# Patient Record
Sex: Male | Born: 1950 | Race: White | Hispanic: No | Marital: Married | Smoking: Former smoker
Health system: Southern US, Community
[De-identification: ages and names within clinical notes are randomized; demographics above are authoritative.]

## PROBLEM LIST (undated history)

## (undated) DIAGNOSIS — I10 Essential (primary) hypertension: Secondary | ICD-10-CM

## (undated) DIAGNOSIS — C801 Malignant (primary) neoplasm, unspecified: Secondary | ICD-10-CM

## (undated) HISTORY — DX: Essential (primary) hypertension: I10

## (undated) HISTORY — DX: Malignant (primary) neoplasm, unspecified: C80.1

## (undated) HISTORY — PX: OTHER SURGICAL HISTORY: SHX169

---

## 2017-12-06 ENCOUNTER — Encounter (HOSPITAL_BASED_OUTPATIENT_CLINIC_OR_DEPARTMENT_OTHER): Payer: Self-pay

## 2017-12-06 DIAGNOSIS — G4733 Obstructive sleep apnea (adult) (pediatric): Secondary | ICD-10-CM

## 2017-12-06 DIAGNOSIS — R0683 Snoring: Secondary | ICD-10-CM

## 2017-12-30 ENCOUNTER — Ambulatory Visit: Payer: Medicare Other | Attending: Emergency Medicine | Admitting: Neurology

## 2017-12-30 DIAGNOSIS — G4733 Obstructive sleep apnea (adult) (pediatric): Secondary | ICD-10-CM | POA: Insufficient documentation

## 2017-12-30 DIAGNOSIS — R0683 Snoring: Secondary | ICD-10-CM | POA: Insufficient documentation

## 2018-01-08 NOTE — Procedures (Signed)
   [place] A. Merlene Laughter, MD     [email or web address]             NOCTURNAL POLYSOMNOGRAPHY   LOCATION: [facility]   Patient Name: Timothy Flores, Timothy Flores Date: 12/30/2017 Gender: Male D.O.B: 06-16-1951 Age (years): 66 Referring Provider: Vidal Schwalbe Height (inches): 65 Interpreting Physician: Phillips Odor MD, ABSM Weight (lbs): 193 RPSGT: Peak, Robert BMI: 32 MRN: [id number] Neck Size: 17.50 CLINICAL INFORMATION Sleep Study Type: NPSG     Indication for sleep study: OSA, Snoring     Epworth Sleepiness Score: 5     SLEEP STUDY TECHNIQUE As per the AASM Manual for the Scoring of Sleep and Associated Events v2.3 (April 2016) with a hypopnea requiring 4% desaturations.  The channels recorded and monitored were frontal, central and occipital EEG, electrooculogram (EOG), submentalis EMG (chin), nasal and oral airflow, thoracic and abdominal wall motion, anterior tibialis EMG, snore microphone, electrocardiogram, and pulse oximetry.  MEDICATIONS Medications self-administered by patient taken the night of the study : N/A No current outpatient medications on file.     SLEEP ARCHITECTURE The study was initiated at 10:53:33 PM and ended at 5:26:27 AM.  Sleep onset time was 174.4 minutes and the sleep efficiency was 35.5%%. The total sleep time was 139.5 minutes.  Stage REM latency was 76.0 minutes.  The patient spent 9.0%% of the night in stage N1 sleep, 88.5%% in stage N2 sleep, 1.4%% in stage N3 and 1.08% in REM.  Alpha intrusion was absent.  Supine sleep was 0.00%.  RESPIRATORY PARAMETERS The overall apnea/hypopnea index (AHI) was 12 per hour. There were 2 total apneas, including 2 obstructive, 0 central and 0 mixed apneas. There were 25 hypopneas and 6 RERAs.  The AHI during Stage REM sleep was 40.0 per hour.  AHI while supine was N/A per hour.  The mean oxygen saturation was 87.9%. The minimum SpO2 during sleep was 76.0%.  loud  snoring was noted during this study.  CARDIAC DATA The 2 lead EKG demonstrated sinus rhythm. The mean heart rate was 68.9 beats per minute. Other EKG findings include: None. LEG MOVEMENT DATA The total PLMS were 0 with a resulting PLMS index of 0.0. Associated arousal with leg movement index was 0.0.  IMPRESSIONS Mild to moderate obstructive sleep apnea is documented during this recording. A formal CPAP titration study is recommended.  Abnormal sleep architecture with markedly reduced sleep efficiency, markedly reduced slow-wave sleep in markedly reduced REM sleep is observed.  Delano Metz, MD Diplomate, American Board of Sleep Medicine. ELECTRONICALLY SIGNED ON:  01/08/2018, 11:48 AM [facility] PH: [phone]   FX: [phone] [place]

## 2018-09-08 ENCOUNTER — Other Ambulatory Visit (HOSPITAL_BASED_OUTPATIENT_CLINIC_OR_DEPARTMENT_OTHER): Payer: Self-pay

## 2018-09-08 DIAGNOSIS — G4730 Sleep apnea, unspecified: Secondary | ICD-10-CM

## 2018-09-22 ENCOUNTER — Ambulatory Visit: Payer: Medicare Other | Attending: Pulmonary Disease | Admitting: Neurology

## 2018-09-22 DIAGNOSIS — G4730 Sleep apnea, unspecified: Secondary | ICD-10-CM | POA: Insufficient documentation

## 2018-09-22 DIAGNOSIS — R0683 Snoring: Secondary | ICD-10-CM | POA: Diagnosis present

## 2018-09-22 DIAGNOSIS — R400 Somnolence: Secondary | ICD-10-CM | POA: Insufficient documentation

## 2018-10-03 NOTE — Procedures (Signed)
    [place] A. Merlene Laughter, MD     [email or web address]             NOCTURNAL POLYSOMNOGRAPHY   LOCATION: [facility]  Patient Name: Timothy Flores, Timothy Flores Date: 09/22/2018 Gender: Male D.O.B: [birth year] Age (years): 66 Referring Provider: Sinda Du Height (inches): 65 Interpreting Physician: Phillips Odor MD, ABSM Weight (lbs): 193 RPSGT: Peak, Robert BMI: 32 MRN: [id number] Neck Size: 17.50 CLINICAL INFORMATION The patient is referred for a BiPAP titration to treat sleep apnea.  Date of NPSG, Split Night or HST:  SLEEP STUDY TECHNIQUE As per the AASM Manual for the Scoring of Sleep and Associated Events v2.3 (April 2016) with a hypopnea requiring 4% desaturations.  The channels recorded and monitored were frontal, central and occipital EEG, electrooculogram (EOG), submentalis EMG (chin), nasal and oral airflow, thoracic and abdominal wall motion, anterior tibialis EMG, snore microphone, electrocardiogram, and pulse oximetry. Bilevel positive airway pressure (BPAP) was initiated at the beginning of the study and titrated to treat sleep-disordered breathing.  MEDICATIONS Medications self-administered by patient taken the night of the study : N/A No current outpatient medications on file.    RESPIRATORY PARAMETERS Optimal IPAP Pressure (cm): 10 AHI at Optimal Pressure (/hr) 0.0 Optimal EPAP Pressure (cm): 6   Overall Minimal O2 (%): 83.0 Minimal O2 at Optimal Pressure (%): 92.0 SLEEP ARCHITECTURE Start Time: 11:13:25 PM Stop Time: 5:19:09 AM Total Time (min): 365.7 Total Sleep Time (min): 158.5 Sleep Latency (min): 118.2 Sleep Efficiency (%): 43.3% REM Latency (min): 81.5 WASO (min): 89.1 Stage N1 (%): 3.5% Stage N2 (%): 81.4% Stage N3 (%): 0.0% Stage R (%): 15.1 Supine (%): 0.00 Arousal Index (/hr): 6.8     CARDIAC DATA The 2 lead EKG demonstrated sinus rhythm. The mean heart rate was 72.1 beats per minute. Other EKG findings include:  None.  LEG MOVEMENT DATA The total Periodic Limb Movements of Sleep (PLMS) were 0. The PLMS index was 0.0. A PLMS index of <15 is considered normal in adults.  IMPRESSIONS The optimal BIPAP selected for this patient are ( 10/6 cm of water)   Delano Metz, MD Diplomate, American Board of Sleep Medicine. ELECTRONICALLY SIGNED ON:  10/03/2018, 9:30 AM [place] PH: [phone]   FX: [phone] [place]

## 2021-01-10 ENCOUNTER — Inpatient Hospital Stay (HOSPITAL_COMMUNITY): Payer: Medicare Other | Attending: Hematology and Oncology | Admitting: Hematology and Oncology

## 2021-01-10 ENCOUNTER — Encounter (HOSPITAL_COMMUNITY): Payer: Self-pay | Admitting: Hematology and Oncology

## 2021-01-10 ENCOUNTER — Inpatient Hospital Stay (HOSPITAL_COMMUNITY): Payer: Medicare Other

## 2021-01-10 ENCOUNTER — Other Ambulatory Visit: Payer: Self-pay

## 2021-01-10 DIAGNOSIS — Z85819 Personal history of malignant neoplasm of unspecified site of lip, oral cavity, and pharynx: Secondary | ICD-10-CM | POA: Insufficient documentation

## 2021-01-10 DIAGNOSIS — I10 Essential (primary) hypertension: Secondary | ICD-10-CM | POA: Diagnosis not present

## 2021-01-10 DIAGNOSIS — Z79899 Other long term (current) drug therapy: Secondary | ICD-10-CM | POA: Diagnosis not present

## 2021-01-10 DIAGNOSIS — E538 Deficiency of other specified B group vitamins: Secondary | ICD-10-CM | POA: Diagnosis not present

## 2021-01-10 DIAGNOSIS — D539 Nutritional anemia, unspecified: Secondary | ICD-10-CM | POA: Insufficient documentation

## 2021-01-10 DIAGNOSIS — Z808 Family history of malignant neoplasm of other organs or systems: Secondary | ICD-10-CM | POA: Diagnosis not present

## 2021-01-10 DIAGNOSIS — D7589 Other specified diseases of blood and blood-forming organs: Secondary | ICD-10-CM | POA: Insufficient documentation

## 2021-01-10 DIAGNOSIS — Z87891 Personal history of nicotine dependence: Secondary | ICD-10-CM | POA: Diagnosis not present

## 2021-01-10 DIAGNOSIS — Z801 Family history of malignant neoplasm of trachea, bronchus and lung: Secondary | ICD-10-CM | POA: Diagnosis not present

## 2021-01-10 LAB — CBC WITH DIFFERENTIAL/PLATELET
Abs Immature Granulocytes: 0.03 10*3/uL (ref 0.00–0.07)
Basophils Absolute: 0 10*3/uL (ref 0.0–0.1)
Basophils Relative: 1 %
Eosinophils Absolute: 0.2 10*3/uL (ref 0.0–0.5)
Eosinophils Relative: 4 %
HCT: 34 % — ABNORMAL LOW (ref 39.0–52.0)
Hemoglobin: 11 g/dL — ABNORMAL LOW (ref 13.0–17.0)
Immature Granulocytes: 1 %
Lymphocytes Relative: 28 %
Lymphs Abs: 1.6 10*3/uL (ref 0.7–4.0)
MCH: 34.1 pg — ABNORMAL HIGH (ref 26.0–34.0)
MCHC: 32.4 g/dL (ref 30.0–36.0)
MCV: 105.3 fL — ABNORMAL HIGH (ref 80.0–100.0)
Monocytes Absolute: 0.6 10*3/uL (ref 0.1–1.0)
Monocytes Relative: 10 %
Neutro Abs: 3.3 10*3/uL (ref 1.7–7.7)
Neutrophils Relative %: 56 %
Platelets: 166 10*3/uL (ref 150–400)
RBC: 3.23 MIL/uL — ABNORMAL LOW (ref 4.22–5.81)
RDW: 13.1 % (ref 11.5–15.5)
WBC: 5.8 10*3/uL (ref 4.0–10.5)
nRBC: 0 % (ref 0.0–0.2)

## 2021-01-10 LAB — HEPATITIS PANEL, ACUTE
HCV Ab: NONREACTIVE
Hep A IgM: NONREACTIVE
Hep B C IgM: NONREACTIVE
Hepatitis B Surface Ag: NONREACTIVE

## 2021-01-10 LAB — RETICULOCYTES
Immature Retic Fract: 13.2 % (ref 2.3–15.9)
RBC.: 3.23 MIL/uL — ABNORMAL LOW (ref 4.22–5.81)
Retic Count, Absolute: 64.3 10*3/uL (ref 19.0–186.0)
Retic Ct Pct: 2 % (ref 0.4–3.1)

## 2021-01-10 LAB — VITAMIN B12: Vitamin B-12: 799 pg/mL (ref 180–914)

## 2021-01-10 LAB — LACTATE DEHYDROGENASE: LDH: 151 U/L (ref 98–192)

## 2021-01-10 NOTE — Assessment & Plan Note (Signed)
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  Patient feels quite well, denies any health complaints at all.  He was drinking about 6-8 beers every day until about a month ago.  He has been taking oral B12 supplementation from over-the-counter. He had about 3 episodes of nosebleeds and he stopped drinking after that.  No alcohol withdrawal. Physical examination, abdominal obesity noted, otherwise no findings. I reviewed his labs from December 20, 2020 which showed a hemoglobin of 8.6 and an MCV of 102.  White count and platelets appeared normal. No evidence of iron deficiency.  No hypercalcemia, elevated alkaline phosphatase or elevated total protein. I have recommended evaluation for H99, folic acid deficiency, SPEP, kappa lambda ratio, hemolysis today. 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 If there is no explanation from the above-mentioned labs, he may need a colonoscopy and a bone marrow biopsy.   He is agreeable to the above-mentioned plan.

## 2021-01-10 NOTE — Progress Notes (Signed)
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  Patient feels quite well, denies any health complaints at all.  He was drinking about 6-8 beers every day until about a month ago.  He has been taking oral B12 supplementation from over-the-counter. He had about 3 episodes of nosebleeds and he stopped drinking after that.  No alcohol withdrawal. Physical examination, abdominal obesity noted, otherwise no findings. I reviewed his labs from December 20, 2020 which showed a hemoglobin of 8.6 and an MCV of 102.  White count and platelets appeared normal. No evidence of iron deficiency.  No hypercalcemia, elevated alkaline phosphatase or elevated total protein. I have recommended evaluation for K44, folic acid deficiency, SPEP, kappa lambda ratio, hemolysis today. 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 If there is no explanation from the above-mentioned labs, he may need a colonoscopy and a bone marrow biopsy.   He is agreeable to the above-mentioned plan.  Orders Placed This Encounter  Procedures  . CBC with Differential/Platelet    Standing Status:   Standing    Number of Occurrences:   22    Standing Expiration Date:   01/10/2022  . Pathologist smear review    Standing Status:   Future    Standing Expiration Date:   01/10/2022  . Vitamin B12    Standing Status:   Future    Standing Expiration Date:   01/10/2022  . Lactate dehydrogenase    Standing Status:   Future    Standing Expiration Date:    01/10/2022  . Reticulocytes    Standing Status:   Future    Standing Expiration Date:   01/10/2022  . Hepatitis panel, acute    Standing Status:   Future    Standing Expiration Date:   01/10/2022  . SPEP (Serum protein electrophoresis)    Standing Status:   Future    Standing Expiration Date:   01/10/2022  . Kappa/lambda light chains    Standing Status:   Future    Standing Expiration Date:   01/10/2022  . Folate RBC    Standing Status:   Future    Standing Expiration Date:   01/10/2022     HISTORY OF PRESENTING ILLNESS:   Timothy Flores 70 y.o. male is here because of macrocytic anemia.  This is a very pleasant 70 year old male patient with past medical history significant for left cheek cancer status post surgery in 2012, hypertension, alcohol injection referred to hematology for evaluation of macrocytic anemia.  Timothy Flores arrived to the appointment today with his wife.  He feels quite well.  He denies any fatigue, shortness of breath on exertion, loss of appetite but does admit to some weight loss.  He and his wife appeared to have some disagreement about how much weight he lost.  He thinks, he may have lost about 5 pounds.  He denies any changes in bowel habits.  He has never had a colonoscopy, did a stool test last year.  No hematochezia or melena.  No change in dietary habits.  He does have some  vitamin B12 deficiency and takes over-the-counter vitamin B12 supplementation.  He used to drink about 6-8 beers a day until about a month ago when he started to have severe nosebleeds and he stopped drinking.  He denies any change in urinary habits. No known autoimmune diseases. Rest of the pertinent 10 point ROS reviewed and negative.  REVIEW OF SYSTEMS:    Constitutional: Denies fevers, chills or abnormal night sweats Eyes: Denies blurriness of vision, double vision or watery eyes Ears, nose, mouth, throat, and face: Denies mucositis or sore throat Respiratory: Denies cough, dyspnea or  wheezes Cardiovascular: Denies palpitation, chest discomfort or lower extremity swelling Gastrointestinal:  Denies nausea, heartburn or change in bowel habits Skin: Denies abnormal skin rashes Lymphatics: Denies new lymphadenopathy or easy bruising Neurological:Denies numbness, tingling or new weaknesses Behavioral/Psych: Mood is stable, no new changes  All other systems were reviewed with the patient and are negative.  MEDICAL HISTORY:  Past Medical History:  Diagnosis Date  . Cancer ([place])    oral cancer-2013  . Hypertension     SURGICAL HISTORY: Past Surgical History:  Procedure Laterality Date  . cancer removal     oral cancer 2013    SOCIAL HISTORY: Social History   Socioeconomic History  . Marital status: Married    Spouse name: Not on file  . Number of children: 2  . Years of education: Not on file  . Highest education level: Not on file  Occupational History  . Occupation: Patent examiner center  . Occupation: retired  . Occupation: Nature conservation officer  Tobacco Use  . Smoking status: Former Smoker    Years: 50.00    Types: Cigarettes  . Smokeless tobacco: Former Network engineer and Sexual Activity  . Alcohol use: Not Currently  . Drug use: Not Currently  . Sexual activity: Not Currently  Other Topics Concern  . Not on file  Social History Narrative  . Not on file   Social Determinants of Health   Financial Resource Strain: Medium Risk  . Difficulty of Paying Living Expenses: Somewhat hard  Food Insecurity: No Food Insecurity  . Worried About Charity fundraiser in the Last Year: Never true  . Ran Out of Food in the Last Year: Never true  Transportation Needs: No Transportation Needs  . Lack of Transportation (Medical): No  . Lack of Transportation (Non-Medical): No  Physical Activity: Not on file  Stress: No Stress Concern Present  . Feeling of Stress : Not at all  Social Connections: Socially Integrated  . Frequency of Communication with Friends and  Family: Twice a week  . Frequency of Social Gatherings with Friends and Family: Once a week  . Attends Religious Services: More than 4 times per year  . Active Member of Clubs or Organizations: Yes  . Attends Archivist Meetings: Never  . Marital Status: Married  Human resources officer Violence: Not At Risk  . Fear of Current or Ex-Partner: No  . Emotionally Abused: No  . Physically Abused: No  . Sexually Abused: No    FAMILY HISTORY: Family History  Problem Relation Age of Onset  . Stroke Mother   . Cancer Paternal Uncle        lung cancer  . Cancer Cousin        brain cancer    ALLERGIES:  has no allergies on file.  MEDICATIONS:  Current Outpatient Medications  Medication Sig Dispense Refill  . allopurinol (ZYLOPRIM) 300 MG tablet Take 300 mg by mouth daily.    Marland Kitchen  Apple Cid Vn-Grn Tea-Bit Or-Cr (APPLE CIDER VINEGAR PLUS) TABS See admin instructions.    . Ascorbic Acid (VITAMIN C) 1000 MG tablet 1 tablet    . cetirizine (ZYRTEC) 10 MG tablet Take 10 mg by mouth daily.    . cholecalciferol (VITAMIN D) 25 MCG (1000 UNIT) tablet 1 tablet    . Cyanocobalamin (VITAMIN B12) 1000 MCG TBCR 1 tablet    . lisinopril (ZESTRIL) 20 MG tablet Take 20 mg by mouth daily.    . montelukast (SINGULAIR) 10 MG tablet take 1 tablet by mouth every evening as needed for allergies.    . Multiple Vitamins-Minerals (CENTRUM ADULTS PO)     . Omega-3 Fatty Acids (FISH OIL) 1000 MG CAPS 1 capsule    . rosuvastatin (CRESTOR) 20 MG tablet Take 20 mg by mouth at bedtime.    . vitamin E 180 MG (400 UNITS) capsule Take 400 Units by mouth daily.    . traZODone (DESYREL) 100 MG tablet Take 100 mg by mouth at bedtime. (Patient not taking: Reported on 01/10/2021)     No current facility-administered medications for this visit.     PHYSICAL EXAMINATION:  ECOG PERFORMANCE STATUS: 0 - Asymptomatic  There were no vitals filed for this visit. There were no vitals filed for this visit.  GENERAL:alert, no  distress and comfortable SKIN: skin color, texture, turgor are normal, no rashes or significant lesions EYES: normal, conjunctiva are pink and non-injected, sclera clear OROPHARYNX:no exudate, no erythema and lips, buccal mucosa, and tongue normal  NECK: supple, thyroid normal size, non-tender, without nodularity LYMPH:  no palpable lymphadenopathy in the cervical, axillary or inguinal LUNGS: clear to auscultation and percussion with normal breathing effort HEART: regular rate & rhythm and no murmurs and no lower extremity edema ABDOMEN:abdomen soft, non-tender and normal bowel sounds Musculoskeletal:no cyanosis of digits and no clubbing  PSYCH: alert & oriented x 3 with fluent speech NEURO: no focal motor/sensory deficits  LABORATORY DATA:  I have reviewed the data as listed No results found for: WBC, HGB, HCT, MCV, PLT   Chemistry   No results found for: NA, K, CL, CO2, BUN, CREATININE, GLU No results found for: CALCIUM, ALKPHOS, AST, ALT, BILITOT     RADIOGRAPHIC STUDIES: I have personally reviewed the radiological images as listed and agreed with the findings in the report. No results found.  All questions were answered. The patient knows to call the clinic with any problems, questions or concerns. I spent 45 minutes in the care of this patient including H and P, review of records, counseling and coordination of care.     Benay Pike, MD 01/10/2021 2:44 PM

## 2021-01-13 LAB — PATHOLOGIST SMEAR REVIEW

## 2021-01-20 ENCOUNTER — Other Ambulatory Visit: Payer: Self-pay

## 2021-01-20 ENCOUNTER — Inpatient Hospital Stay (HOSPITAL_COMMUNITY): Payer: Medicare Other | Attending: Hematology

## 2021-01-20 DIAGNOSIS — D539 Nutritional anemia, unspecified: Secondary | ICD-10-CM | POA: Insufficient documentation

## 2021-01-21 LAB — KAPPA/LAMBDA LIGHT CHAINS
Kappa free light chain: 32.9 mg/L — ABNORMAL HIGH (ref 3.3–19.4)
Kappa, lambda light chain ratio: 1.38 (ref 0.26–1.65)
Lambda free light chains: 23.9 mg/L (ref 5.7–26.3)

## 2021-01-21 LAB — FOLATE RBC
Folate, Hemolysate: 570 ng/mL
Folate, RBC: 1686 ng/mL (ref >498)
Hematocrit: 33.8 % — ABNORMAL LOW (ref 37.5–51.0)

## 2021-01-23 LAB — PROTEIN ELECTROPHORESIS, SERUM
A/G Ratio: 1.5 (ref 0.7–1.7)
Albumin ELP: 3.8 g/dL (ref 2.9–4.4)
Alpha-1-Globulin: 0.2 g/dL (ref 0.0–0.4)
Alpha-2-Globulin: 0.7 g/dL (ref 0.4–1.0)
Beta Globulin: 0.9 g/dL (ref 0.7–1.3)
Gamma Globulin: 0.8 g/dL (ref 0.4–1.8)
Globulin, Total: 2.6 g/dL (ref 2.2–3.9)
Total Protein ELP: 6.4 g/dL (ref 6.0–8.5)

## 2021-01-27 ENCOUNTER — Ambulatory Visit (HOSPITAL_COMMUNITY): Payer: Medicare Other | Admitting: Hematology and Oncology

## 2021-01-27 ENCOUNTER — Inpatient Hospital Stay (HOSPITAL_BASED_OUTPATIENT_CLINIC_OR_DEPARTMENT_OTHER): Payer: Medicare Other | Admitting: Hematology and Oncology

## 2021-01-27 ENCOUNTER — Other Ambulatory Visit: Payer: Self-pay

## 2021-01-27 VITALS — BP 126/64 | HR 80 | Temp 97.2°F | Resp 18 | Wt 177.8 lb

## 2021-01-27 DIAGNOSIS — D539 Nutritional anemia, unspecified: Secondary | ICD-10-CM | POA: Diagnosis not present

## 2021-01-27 NOTE — Progress Notes (Signed)
[facility] NOTE  Patient Care Team: Vidal Schwalbe, MD as PCP - General (Family Medicine)  CHIEF COMPLAINTS/PURPOSE OF CONSULTATION:  Macrocytic anemia  ASSESSMENT & PLAN:  No problem-specific Assessment & Plan notes found for this encounter.  No orders of the defined types were placed in this encounter.  This is a very pleasant 70 year old male patient with macrocytosis and anemia referred to hematology for additional recommendations. He denies any major complaints except for heavy alcohol intake and nosebleeds. During his initial visit we recommended further investigation, normal E33 and folic acid levels.  Hepatitis panel nonreactive.  No evidence of hemolysis.  No evidence of monoclonal protein. We have discussed that this macrocytosis could be a sign of early dysplastic changes in the bone marrow but since he does not need immediate treatment have given him 2 options.  We could proceed with bone marrow aspiration biopsy now versus follow him up in 3 months versus do bone marrow aspiration biopsy in the future when there is decline in counts.  He is agreeable to monitoring.  Encouraged alcohol cessation. He will return to clinic in 3 months with repeat labs.  HISTORY OF PRESENTING ILLNESS:   Timothy Flores 70 y.o. male is here because of macrocytic anemia.  This is a very pleasant 70 year old male patient with past medical history significant for left cheek cancer status post surgery in 2012, hypertension, referred to hematology for evaluation of macrocytic anemia.  [name] arrived to the appointment today with his wife.     Interval history  Since his last visit, he has no complaints. No more during his initial visit, recommended additional He stopped drinking completely. No change in breathing, bowel or urinary habits. He feels quite well  Rest of the pertinent 10 point ROS reviewed and negative.  REVIEW OF SYSTEMS:    Constitutional: Denies  fevers, chills or abnormal night sweats Eyes: Denies blurriness of vision, double vision or watery eyes Ears, nose, mouth, throat, and face: Denies mucositis or sore throat Respiratory: Denies cough, dyspnea or wheezes Cardiovascular: Denies palpitation, chest discomfort or lower extremity swelling Gastrointestinal:  Denies nausea, heartburn or change in bowel habits Skin: Denies abnormal skin rashes Lymphatics: Denies new lymphadenopathy or easy bruising Neurological:Denies numbness, tingling or new weaknesses Behavioral/Psych: Mood is stable, no new changes  All other systems were reviewed with the patient and are negative.  MEDICAL HISTORY:  Past Medical History:  Diagnosis Date  . Cancer ([place])    oral cancer-2013  . Hypertension     SURGICAL HISTORY: Past Surgical History:  Procedure Laterality Date  . cancer removal     oral cancer 2013    SOCIAL HISTORY: Social History   Socioeconomic History  . Marital status: Married    Spouse name: Not on file  . Number of children: 2  . Years of education: Not on file  . Highest education level: Not on file  Occupational History  . Occupation: Patent examiner center  . Occupation: retired  . Occupation: Nature conservation officer  Tobacco Use  . Smoking status: Former Smoker    Years: 50.00    Types: Cigarettes  . Smokeless tobacco: Former Network engineer and Sexual Activity  . Alcohol use: Not Currently  . Drug use: Not Currently  . Sexual activity: Not Currently  Other Topics Concern  . Not on file  Social History Narrative  . Not on file   Social Determinants of Health   Financial Resource Strain: Medium Risk  . Difficulty  of Paying Living Expenses: Somewhat hard  Food Insecurity: No Food Insecurity  . Worried About Charity fundraiser in the Last Year: Never true  . Ran Out of Food in the Last Year: Never true  Transportation Needs: No Transportation Needs  . Lack of Transportation (Medical): No  . Lack of  Transportation (Non-Medical): No  Physical Activity: Not on file  Stress: No Stress Concern Present  . Feeling of Stress : Not at all  Social Connections: Socially Integrated  . Frequency of Communication with Friends and Family: Twice a week  . Frequency of Social Gatherings with Friends and Family: Once a week  . Attends Religious Services: More than 4 times per year  . Active Member of Clubs or Organizations: Yes  . Attends Archivist Meetings: Never  . Marital Status: Married  Human resources officer Violence: Not At Risk  . Fear of Current or Ex-Partner: No  . Emotionally Abused: No  . Physically Abused: No  . Sexually Abused: No    FAMILY HISTORY: Family History  Problem Relation Age of Onset  . Stroke Mother   . Cancer Paternal Uncle        lung cancer  . Cancer Cousin        brain cancer    ALLERGIES:  has no allergies on file.  MEDICATIONS:  Current Outpatient Medications  Medication Sig Dispense Refill  . allopurinol (ZYLOPRIM) 300 MG tablet Take 300 mg by mouth daily.    Marland Kitchen Apple Cid Vn-Grn Tea-Bit Or-Cr (APPLE CIDER VINEGAR PLUS) TABS See admin instructions.    . Ascorbic Acid (VITAMIN C) 1000 MG tablet 1 tablet    . cetirizine (ZYRTEC) 10 MG tablet Take 10 mg by mouth daily.    . cholecalciferol (VITAMIN D) 25 MCG (1000 UNIT) tablet 1 tablet    . Cyanocobalamin (VITAMIN B12) 1000 MCG TBCR 1 tablet    . lisinopril (ZESTRIL) 20 MG tablet Take 20 mg by mouth daily.    . montelukast (SINGULAIR) 10 MG tablet take 1 tablet by mouth every evening as needed for allergies.    . Multiple Vitamins-Minerals (CENTRUM ADULTS PO)     . Omega-3 Fatty Acids (FISH OIL) 1000 MG CAPS 1 capsule    . rosuvastatin (CRESTOR) 20 MG tablet Take 20 mg by mouth at bedtime.    . traZODone (DESYREL) 100 MG tablet Take 100 mg by mouth at bedtime.    . vitamin E 180 MG (400 UNITS) capsule Take 400 Units by mouth daily.     No current facility-administered medications for this visit.      PHYSICAL EXAMINATION:  ECOG PERFORMANCE STATUS: 0 - Asymptomatic  Vitals:   01/27/21 1412  BP: 126/64  Pulse: 80  Resp: 18  Temp: (!) 97.2 F (36.2 C)  SpO2: 99%   Filed Weights   01/27/21 1412  Weight: 177 lb 12.8 oz (80.6 kg)    GENERAL:alert, no distress and comfortable SKIN: skin color, texture, turgor are normal, no rashes or significant lesions EYES: normal, conjunctiva are pink and non-injected, sclera clear OROPHARYNX:no exudate, no erythema and lips, buccal mucosa, and tongue normal  NECK: supple, thyroid normal size, non-tender, without nodularity LYMPH:  no palpable lymphadenopathy in the cervical, axillary or inguinal LUNGS: clear to auscultation and percussion with normal breathing effort HEART: regular rate & rhythm and no murmurs and no lower extremity edema ABDOMEN:abdomen soft, non-tender and normal bowel sounds Musculoskeletal:no cyanosis of digits and no clubbing  PSYCH: alert & oriented x  3 with fluent speech NEURO: no focal motor/sensory deficits  LABORATORY DATA:  I have reviewed the data as listed Lab Results  Component Value Date   WBC 5.8 01/10/2021   HGB 11.0 (L) 01/10/2021   HCT 33.8 (L) 01/20/2021   MCV 105.3 (H) 01/10/2021   PLT 166 01/10/2021     Chemistry   No results found for: NA, K, CL, CO2, BUN, CREATININE, GLU No results found for: CALCIUM, ALKPHOS, AST, ALT, BILITOT   We reviewed labs from today. Macrocytosis, otherwise no major concerns. SPEP neg B12 and folate normal  RADIOGRAPHIC STUDIES: I have personally reviewed the radiological images as listed and agreed with the findings in the report. No results found.  All questions were answered. The patient knows to call the clinic with any problems, questions or concerns. I spent 20 minutes in the care of this patient including H and P, review of records, counseling and coordination of care.     Benay Pike, MD 01/27/2021 2:39 PM

## 2021-01-28 ENCOUNTER — Encounter (HOSPITAL_COMMUNITY): Payer: Self-pay | Admitting: Hematology and Oncology

## 2021-04-29 ENCOUNTER — Other Ambulatory Visit (HOSPITAL_COMMUNITY): Payer: Medicare Other

## 2021-04-29 ENCOUNTER — Other Ambulatory Visit: Payer: Self-pay

## 2021-04-29 ENCOUNTER — Inpatient Hospital Stay (HOSPITAL_COMMUNITY): Payer: Medicare Other | Attending: Hematology

## 2021-04-29 DIAGNOSIS — D696 Thrombocytopenia, unspecified: Secondary | ICD-10-CM | POA: Insufficient documentation

## 2021-04-29 DIAGNOSIS — D539 Nutritional anemia, unspecified: Secondary | ICD-10-CM | POA: Insufficient documentation

## 2021-04-29 LAB — CBC WITH DIFFERENTIAL/PLATELET
Abs Immature Granulocytes: 0.03 10*3/uL (ref 0.00–0.07)
Basophils Absolute: 0 10*3/uL (ref 0.0–0.1)
Basophils Relative: 1 %
Eosinophils Absolute: 0.3 10*3/uL (ref 0.0–0.5)
Eosinophils Relative: 5 %
HCT: 32.4 % — ABNORMAL LOW (ref 39.0–52.0)
Hemoglobin: 11 g/dL — ABNORMAL LOW (ref 13.0–17.0)
Immature Granulocytes: 1 %
Lymphocytes Relative: 38 %
Lymphs Abs: 2.3 10*3/uL (ref 0.7–4.0)
MCH: 33.8 pg (ref 26.0–34.0)
MCHC: 34 g/dL (ref 30.0–36.0)
MCV: 99.7 fL (ref 80.0–100.0)
Monocytes Absolute: 0.6 10*3/uL (ref 0.1–1.0)
Monocytes Relative: 10 %
Neutro Abs: 2.9 10*3/uL (ref 1.7–7.7)
Neutrophils Relative %: 45 %
Platelets: 134 10*3/uL — ABNORMAL LOW (ref 150–400)
RBC: 3.25 MIL/uL — ABNORMAL LOW (ref 4.22–5.81)
RDW: 16.1 % — ABNORMAL HIGH (ref 11.5–15.5)
WBC: 6.1 10*3/uL (ref 4.0–10.5)
nRBC: 0 % (ref 0.0–0.2)

## 2021-04-29 LAB — COMPREHENSIVE METABOLIC PANEL
ALT: 18 U/L (ref 0–44)
AST: 26 U/L (ref 15–41)
Albumin: 3.9 g/dL (ref 3.5–5.0)
Alkaline Phosphatase: 60 U/L (ref 38–126)
Anion gap: 7 (ref 5–15)
BUN: 21 mg/dL (ref 8–23)
CO2: 25 mmol/L (ref 22–32)
Calcium: 9 mg/dL (ref 8.9–10.3)
Chloride: 107 mmol/L (ref 98–111)
Creatinine, Ser: 1.01 mg/dL (ref 0.61–1.24)
GFR, Estimated: >60 mL/min (ref >60)
Glucose, Bld: 106 mg/dL — ABNORMAL HIGH (ref 70–99)
Potassium: 3.8 mmol/L (ref 3.5–5.1)
Sodium: 139 mmol/L (ref 135–145)
Total Bilirubin: 0.6 mg/dL (ref 0.3–1.2)
Total Protein: 6.8 g/dL (ref 6.5–8.1)

## 2021-05-05 NOTE — Progress Notes (Signed)
[place],  [id number]   CLINIC:  Medical Oncology/Hematology  PCP:  Vidal Schwalbe, MD [address] [place] [address] [phone]   REASON FOR VISIT:  Follow-up for macrocytic anemia  PRIOR THERAPY: None  CURRENT THERAPY: Observation  INTERVAL HISTORY:  Timothy Flores 70 y.o. male returns for routine follow-up of macrocytic anemia.  He was last seen by Dr. Chryl Heck on 01/27/2021.  At today's visit, he reports feeling fairly well.  No recent hospitalizations, surgeries, or changes in baseline health status.  He denies any current signs or symptoms of blood loss or severe taxis, bright red blood per rectum, or melena.  He denies easy bruising and petechial rash.  He has not noticed any chest pain, dyspnea on exertion, excessive fatigue, palpitations, or syncopal episodes.  Continues to report that he stopped alcohol intake after his visit in March 2022.  After stopping alcohol, he has lost about 14 pounds.  However, he denies any other B symptoms such as fever, chills, night sweats.  He has 100% energy and 100% appetite. He endorses that he is maintaining a stable weight.    REVIEW OF SYSTEMS:  Review of Systems  Constitutional:  Negative for appetite change, chills, diaphoresis, fatigue, fever and unexpected weight change.  HENT:   Negative for lump/mass and nosebleeds.   Eyes:  Negative for eye problems.  Respiratory:  Negative for cough, hemoptysis and shortness of breath.   Cardiovascular:  Negative for chest pain, leg swelling and palpitations.  Gastrointestinal:  Negative for abdominal pain, blood in stool, constipation, diarrhea, nausea and vomiting.  Genitourinary:  Negative for hematuria.   Skin: Negative.   Neurological:  Negative for dizziness, headaches and light-headedness.  Hematological:  Does not bruise/bleed easily.     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   Hypertension    Past Surgical History:  Procedure Laterality Date   cancer removal     oral cancer 2013     SOCIAL HISTORY:  Social History   Socioeconomic History   Marital status: Married    Spouse name: Not on file   Number of children: 2   Years of education: Not on file   Highest education level: Not on file  Occupational History   Occupation: [employer] examiner [employer]   Occupation: retired   Occupation: Nature conservation officer  Tobacco Use   Smoking status: Former    Years: 50.00    Types: Cigarettes   Smokeless tobacco: Former  Substance and Sexual Activity   Alcohol use: Not Currently   Drug use: Not Currently   Sexual activity: Not Currently  Other Topics Concern   Not on file  Social History Narrative   Not on file   Social Determinants of Health   Financial Resource Strain: Medium Risk   Difficulty of Paying Living Expenses: Somewhat hard  Food Insecurity: No Food Insecurity   Worried About Charity fundraiser in the Last Year: Never true   Arboriculturist in the Last Year: Never true  Transportation Needs: No Transportation Needs   Lack of Transportation (Medical): No   Lack of Transportation (Non-Medical): No  Physical Activity: Not on file  Stress: No Stress Concern Present   Feeling of Stress : Not at all  Social Connections: Socially Integrated   Frequency of Communication with Friends and Family: Twice a week   Frequency of Social  Gatherings with Friends and Family: Once a week   Attends Religious Services: More than 4 times per year   Active Member of [employer] or Organizations: Yes   Attends Archivist Meetings: Never   Marital Status: Married  Human resources officer Violence: Not At Risk   Fear of Current or Ex-Partner: No   Emotionally Abused: No   Physically Abused: No   Sexually Abused: No    FAMILY HISTORY:  Family History  Problem Relation Age of Onset   Stroke Mother    Cancer Paternal Uncle        lung cancer    Cancer Cousin        brain cancer    CURRENT MEDICATIONS:  Outpatient Encounter Medications as of 05/06/2021  Medication Sig   allopurinol (ZYLOPRIM) 300 MG tablet Take 300 mg by mouth daily.   Apple Cid Vn-Grn Tea-Bit Or-Cr (APPLE CIDER VINEGAR PLUS) TABS See admin instructions.   Ascorbic Acid (VITAMIN C) 1000 MG tablet 1 tablet   cetirizine (ZYRTEC) 10 MG tablet Take 10 mg by mouth daily.   cholecalciferol (VITAMIN D) 25 MCG (1000 UNIT) tablet 1 tablet   Cyanocobalamin (VITAMIN B12) 1000 MCG TBCR 1 tablet   lisinopril (ZESTRIL) 20 MG tablet Take 20 mg by mouth daily.   montelukast (SINGULAIR) 10 MG tablet take 1 tablet by mouth every evening as needed for allergies.   Multiple Vitamins-Minerals (CENTRUM ADULTS PO)    Omega-3 Fatty Acids (FISH OIL) 1000 MG CAPS 1 capsule   rosuvastatin (CRESTOR) 20 MG tablet Take 20 mg by mouth at bedtime.   traZODone (DESYREL) 100 MG tablet Take 100 mg by mouth at bedtime.   vitamin E 180 MG (400 UNITS) capsule Take 400 Units by mouth daily.   No facility-administered encounter medications on file as of 05/06/2021.    ALLERGIES:  Not on File   PHYSICAL EXAM:  ECOG PERFORMANCE STATUS: 0 - Asymptomatic  There were no vitals filed for this visit. There were no vitals filed for this visit. Physical Exam Constitutional:      Appearance: Normal appearance. He is obese.  HENT:     Head: Normocephalic and atraumatic.     Mouth/Throat:     Mouth: Mucous membranes are moist.  Eyes:     Extraocular Movements: Extraocular movements intact.     Pupils: Pupils are equal, round, and reactive to light.  Cardiovascular:     Rate and Rhythm: Normal rate and regular rhythm.     Pulses: Normal pulses.     Heart sounds: Normal heart sounds.  Pulmonary:     Effort: Pulmonary effort is normal.     Breath sounds: Normal breath sounds.  Abdominal:     General: Bowel sounds are normal.     Palpations: Abdomen is soft.     Tenderness: There is no abdominal  tenderness.  Musculoskeletal:        General: No swelling.     Right lower leg: No edema.     Left lower leg: No edema.  Lymphadenopathy:     Cervical: No cervical adenopathy.  Skin:    General: Skin is warm and dry.  Neurological:     General: No focal deficit present.     Mental Status: He is alert and oriented to person, place, and time.  Psychiatric:        Mood and Affect: Mood normal.        Behavior: Behavior normal.     LABORATORY DATA:  I  have reviewed the labs as listed.  CBC    Component Value Date/Time   WBC 6.1 04/29/2021 1419   RBC 3.25 (L) 04/29/2021 1419   HGB 11.0 (L) 04/29/2021 1419   HCT 32.4 (L) 04/29/2021 1419   HCT 33.8 (L) 01/20/2021 1416   PLT 134 (L) 04/29/2021 1419   MCV 99.7 04/29/2021 1419   MCH 33.8 04/29/2021 1419   MCHC 34.0 04/29/2021 1419   RDW 16.1 (H) 04/29/2021 1419   LYMPHSABS 2.3 04/29/2021 1419   MONOABS 0.6 04/29/2021 1419   EOSABS 0.3 04/29/2021 1419   BASOSABS 0.0 04/29/2021 1419   CMP Latest Ref Rng & Units 04/29/2021  Glucose 70 - 99 mg/dL 106(H)  BUN 8 - 23 mg/dL 21  Creatinine 0.61 - 1.24 mg/dL 1.01  Sodium 135 - 145 mmol/L 139  Potassium 3.5 - 5.1 mmol/L 3.8  Chloride 98 - 111 mmol/L 107  CO2 22 - 32 mmol/L 25  Calcium 8.9 - 10.3 mg/dL 9.0  Total Protein 6.5 - 8.1 g/dL 6.8  Total Bilirubin 0.3 - 1.2 mg/dL 0.6  Alkaline Phos 38 - 126 U/L 60  AST 15 - 41 U/L 26  ALT 0 - 44 U/L 18    DIAGNOSTIC IMAGING:  I have independently reviewed the relevant imaging and discussed with the patient.  ASSESSMENT & PLAN: 1.  Macrocytic anemia - Normal A41 and folic acid levels, hepatitis panel nonreactive, no evidence of hemolysis or monoclonal protein - Reticulocyte index of 1.08 indicates hypoproliferation - Macrocytosis could be a sign of early dysplastic changes in the bone marrow - Patient also has history of alcohol consumption, but has stopped drinking entirely since 01/10/2021  - Patient opted to observe for now, but  will consider bone marrow biopsy aspiration in the future if there is a decline in blood counts - Most recent labs (04/29/2021): Stable Hgb 11.0, but with decreased macrocytosis (MCV 99.7) - PLAN: Repeat labs and RTC in 4 months (include thyroid studies and iron studies with next lab draw).  Will readdress possible bone marrow biopsy if any significant deviation from baseline.  2.  Thrombocytopenia, mild - CBC on 04/29/2021 notes mild thrombocytopenia with platelets 134 - Normal Y60 and folic acid levels, hepatitis panel nonreactive, no evidence of hemolysis or monoclonal protein - Patient may have some underlying fatty liver disease or alcohol related liver disease causing splenomegaly - PLAN: No indication for treatment at this time.  Will monitor with repeat CBC in 4 months.   PLAN SUMMARY & DISPOSITION: -Labs and RTC in 4 months  All questions were answered. The patient knows to call the clinic with any problems, questions or concerns.  Medical decision making: Low  Time spent on visit: I spent 15 minutes counseling the patient face to face. The total time spent in the appointment was 25 minutes and more than 50% was on counseling.   Harriett Rush, PA-C  05/06/2021 3:15 PM

## 2021-05-06 ENCOUNTER — Inpatient Hospital Stay (HOSPITAL_BASED_OUTPATIENT_CLINIC_OR_DEPARTMENT_OTHER): Payer: Medicare Other | Admitting: Physician Assistant

## 2021-05-06 ENCOUNTER — Ambulatory Visit (HOSPITAL_COMMUNITY): Payer: Medicare Other | Admitting: Physician Assistant

## 2021-05-06 ENCOUNTER — Other Ambulatory Visit: Payer: Self-pay

## 2021-05-06 VITALS — BP 102/51 | HR 72 | Temp 96.9°F | Resp 18 | Wt 163.7 lb

## 2021-05-06 DIAGNOSIS — D539 Nutritional anemia, unspecified: Secondary | ICD-10-CM | POA: Diagnosis not present

## 2021-05-06 NOTE — Patient Instructions (Signed)
[place] at [facility] Discharge Instructions  You were seen today by Tarri Abernethy PA-C for your anemia.  As we have previously discussed, we do not know the specific cause of your anemia, and would need to perform a bone marrow biopsy for more information.  However, this is not strictly necessary at this time, and we can continue to watch and wait and see how your blood counts look over the next months and years.  We will reconsider bone marrow in the future if we notice any significant changes in your blood.  Continue to stay away from drinking alcohol, as this can also be toxic to your bone marrow and cause many other health conditions.  LABS: Return in 4 months for repeat labs  OTHER TESTS: None  MEDICATIONS: No changes to home medications  FOLLOW-UP APPOINTMENT: Office visit in 4 months, about a week after your labs are checked   Thank you for choosing [place] at [facility] to provide your oncology and hematology care.  To afford each patient quality time with our provider, please arrive at least 15 minutes before your scheduled appointment time.   If you have a lab appointment with the [facility] please come in thru the Main Entrance and check in at the main information desk.  You need to re-schedule your appointment should you arrive 10 or more minutes late.  We strive to give you quality time with our providers, and arriving late affects you and other patients whose appointments are after yours.  Also, if you no show three or more times for appointments you may be dismissed from the clinic at the providers discretion.     Again, thank you for choosing [facility].  Our hope is that these requests will decrease the amount of time that you wait before being seen by our physicians.       _____________________________________________________________  Should you have questions after your visit to [facility], please contact our office at [phone] and follow the prompts.  Our office hours are 8:00 a.m. and 4:30 p.m. Monday - Friday.  Please note that voicemails left after 4:00 p.m. may not be returned until the following business day.  We are closed weekends and major holidays.  You do have access to a nurse 24-7, just call the main number to the clinic [phone] and do not press any options, hold on the line and a nurse will answer the phone.    For prescription refill requests, have your pharmacy contact our office and allow 72 hours.    Due to Covid, you will need to wear a mask upon entering the hospital. If you do not have a mask, a mask will be given to you at the Main Entrance upon arrival. For doctor visits, patients may have 1 support person age 48 or older with them. For treatment visits, patients can not have anyone with them due to social distancing guidelines and our immunocompromised population.

## 2021-09-03 ENCOUNTER — Inpatient Hospital Stay (HOSPITAL_COMMUNITY): Payer: Medicare Other | Attending: Hematology

## 2021-09-03 ENCOUNTER — Other Ambulatory Visit: Payer: Self-pay

## 2021-09-03 DIAGNOSIS — Z79899 Other long term (current) drug therapy: Secondary | ICD-10-CM | POA: Diagnosis not present

## 2021-09-03 DIAGNOSIS — D539 Nutritional anemia, unspecified: Secondary | ICD-10-CM | POA: Diagnosis not present

## 2021-09-03 LAB — CBC WITH DIFFERENTIAL/PLATELET
Abs Immature Granulocytes: 0.04 10*3/uL (ref 0.00–0.07)
Basophils Absolute: 0 10*3/uL (ref 0.0–0.1)
Basophils Relative: 1 %
Eosinophils Absolute: 0.2 10*3/uL (ref 0.0–0.5)
Eosinophils Relative: 4 %
HCT: 36.9 % — ABNORMAL LOW (ref 39.0–52.0)
Hemoglobin: 12.6 g/dL — ABNORMAL LOW (ref 13.0–17.0)
Immature Granulocytes: 1 %
Lymphocytes Relative: 36 %
Lymphs Abs: 2.3 10*3/uL (ref 0.7–4.0)
MCH: 34.9 pg — ABNORMAL HIGH (ref 26.0–34.0)
MCHC: 34.1 g/dL (ref 30.0–36.0)
MCV: 102.2 fL — ABNORMAL HIGH (ref 80.0–100.0)
Monocytes Absolute: 0.5 10*3/uL (ref 0.1–1.0)
Monocytes Relative: 8 %
Neutro Abs: 3.4 10*3/uL (ref 1.7–7.7)
Neutrophils Relative %: 50 %
Platelets: 144 10*3/uL — ABNORMAL LOW (ref 150–400)
RBC: 3.61 MIL/uL — ABNORMAL LOW (ref 4.22–5.81)
RDW: 13.5 % (ref 11.5–15.5)
WBC: 6.5 10*3/uL (ref 4.0–10.5)
nRBC: 0 % (ref 0.0–0.2)

## 2021-09-03 LAB — COMPREHENSIVE METABOLIC PANEL
ALT: 22 U/L (ref 0–44)
AST: 27 U/L (ref 15–41)
Albumin: 4.2 g/dL (ref 3.5–5.0)
Alkaline Phosphatase: 69 U/L (ref 38–126)
Anion gap: 8 (ref 5–15)
BUN: 19 mg/dL (ref 8–23)
CO2: 26 mmol/L (ref 22–32)
Calcium: 9.1 mg/dL (ref 8.9–10.3)
Chloride: 103 mmol/L (ref 98–111)
Creatinine, Ser: 0.91 mg/dL (ref 0.61–1.24)
GFR, Estimated: >60 mL/min (ref >60)
Glucose, Bld: 104 mg/dL — ABNORMAL HIGH (ref 70–99)
Potassium: 4.3 mmol/L (ref 3.5–5.1)
Sodium: 137 mmol/L (ref 135–145)
Total Bilirubin: 1 mg/dL (ref 0.3–1.2)
Total Protein: 7.2 g/dL (ref 6.5–8.1)

## 2021-09-03 LAB — IRON AND TIBC
Iron: 110 ug/dL (ref 45–182)
Saturation Ratios: 30 % (ref 17.9–39.5)
TIBC: 371 ug/dL (ref 250–450)
UIBC: 261 ug/dL

## 2021-09-03 LAB — FERRITIN: Ferritin: 25 ng/mL (ref 24–336)

## 2021-09-03 LAB — TSH: TSH: 2.867 u[IU]/mL (ref 0.350–4.500)

## 2021-09-09 NOTE — Progress Notes (Deleted)
NO SHOW

## 2021-09-10 ENCOUNTER — Other Ambulatory Visit (HOSPITAL_COMMUNITY): Payer: Self-pay | Admitting: Physician Assistant

## 2021-09-10 ENCOUNTER — Ambulatory Visit (HOSPITAL_COMMUNITY): Payer: Medicare Other | Admitting: Physician Assistant

## 2021-09-10 DIAGNOSIS — D539 Nutritional anemia, unspecified: Secondary | ICD-10-CM

## 2021-09-10 DIAGNOSIS — D696 Thrombocytopenia, unspecified: Secondary | ICD-10-CM

## 2021-11-03 ENCOUNTER — Ambulatory Visit (HOSPITAL_COMMUNITY)
Admission: RE | Admit: 2021-11-03 | Discharge: 2021-11-03 | Disposition: A | Discharge status: Home / Self Care | Payer: Medicare PPO | Source: Ambulatory Visit | Attending: Physician Assistant | Admitting: Physician Assistant

## 2021-11-03 ENCOUNTER — Other Ambulatory Visit: Payer: Self-pay

## 2021-11-03 DIAGNOSIS — D539 Nutritional anemia, unspecified: Secondary | ICD-10-CM | POA: Diagnosis not present

## 2021-11-03 DIAGNOSIS — D696 Thrombocytopenia, unspecified: Secondary | ICD-10-CM | POA: Diagnosis present

## 2021-11-07 ENCOUNTER — Other Ambulatory Visit (HOSPITAL_COMMUNITY): Payer: Self-pay | Admitting: Physician Assistant

## 2021-11-07 DIAGNOSIS — D696 Thrombocytopenia, unspecified: Secondary | ICD-10-CM

## 2021-11-07 DIAGNOSIS — D539 Nutritional anemia, unspecified: Secondary | ICD-10-CM

## 2021-11-07 NOTE — Progress Notes (Signed)
[place], [place] [address]   CLINIC:  Medical Oncology/Hematology  PCP:  Vidal Schwalbe, MD [address] [place] [address] [phone]   REASON FOR VISIT:  Follow-up for macrocytic anemia   PRIOR THERAPY: None   CURRENT THERAPY: Observation  INTERVAL HISTORY:  Mr. Timothy Flores 71 y.o. male returns for routine follow-up of macrocytic anemia and thrombocytopenia.  He was last seen by Tarri Abernethy, PA-C on 05/06/2021.  At today's visit, he reports feeling well.  No recent hospitalizations, surgeries, or changes in baseline health status.  He admits to easy bruising, but denies any petechial rash.  No signs of bleeding such as epistaxis, gum bleeding, hematuria, hematemesis, hematochezia, or melena.  He denies any B symptoms such as fever, chills, night sweats, unintentional weight loss.  As previously noted, he stopped drinking alcohol in March 2022, and has not started that again.  He has 100% energy and 100% appetite. He endorses that he is maintaining a stable weight.   REVIEW OF SYSTEMS:  Review of Systems  Constitutional:  Negative for appetite change, chills, diaphoresis, fatigue, fever and unexpected weight change.  HENT:   Negative for lump/mass and nosebleeds.   Eyes:  Negative for eye problems.  Respiratory:  Negative for cough, hemoptysis and shortness of breath.   Cardiovascular:  Negative for chest pain, leg swelling and palpitations.  Gastrointestinal:  Negative for abdominal pain, blood in stool, constipation, diarrhea, nausea and vomiting.  Genitourinary:  Negative for hematuria.   Skin: Negative.   Neurological:  Negative for dizziness, headaches and light-headedness.  Hematological:  Bruises/bleeds easily (bruising).     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   Hypertension    Past Surgical History:  Procedure Laterality Date   cancer  removal     oral cancer 2013     SOCIAL HISTORY:  Social History   Socioeconomic History   Marital status: Married    Spouse name: Not on file   Number of children: 2   Years of education: Not on file   Highest education level: Not on file  Occupational History   Occupation: [employer] examiner [employer]   Occupation: retired   Occupation: Nature conservation officer  Tobacco Use   Smoking status: Former    Years: 50.00    Types: Cigarettes   Smokeless tobacco: Former  Substance and Sexual Activity   Alcohol use: Not Currently   Drug use: Not Currently   Sexual activity: Not Currently  Other Topics Concern   Not on file  Social History Narrative   Not on file   Social Determinants of Health   Financial Resource Strain: Medium Risk   Difficulty of Paying Living Expenses: Somewhat hard  Food Insecurity: No Food Insecurity   Worried About Charity fundraiser in the Last Year: Never true   Arboriculturist in the Last Year: Never true  Transportation Needs: No Transportation Needs   Lack of Transportation (Medical): No   Lack of Transportation (Non-Medical): No  Physical Activity: Not on file  Stress: No Stress Concern Present   Feeling of Stress : Not at all  Social Connections: Socially Integrated   Frequency of Communication with Friends and Family: Twice a week   Frequency of Social Gatherings with Friends and Family: Once a week   Attends Religious Services: More than 4 times per year   Active Member of [employer]  or Organizations: Yes   Attends Archivist Meetings: Never   Marital Status: Married  Human resources officer Violence: Not At Risk   Fear of Current or Ex-Partner: No   Emotionally Abused: No   Physically Abused: No   Sexually Abused: No    FAMILY HISTORY:  Family History  Problem Relation Age of Onset   Stroke Mother    Cancer Paternal Uncle        lung cancer   Cancer Cousin        brain cancer    CURRENT MEDICATIONS:  Outpatient Encounter Medications as of  11/10/2021  Medication Sig   allopurinol (ZYLOPRIM) 300 MG tablet Take 300 mg by mouth daily.   Apple Cid Vn-Grn Tea-Bit Or-Cr (APPLE CIDER VINEGAR PLUS) TABS See admin instructions.   Ascorbic Acid (VITAMIN C) 1000 MG tablet 1 tablet   cetirizine (ZYRTEC) 10 MG tablet Take 10 mg by mouth daily.   cholecalciferol (VITAMIN D) 25 MCG (1000 UNIT) tablet 1 tablet   Cyanocobalamin (VITAMIN B12) 1000 MCG TBCR 1 tablet   lisinopril (ZESTRIL) 20 MG tablet Take 20 mg by mouth daily.   montelukast (SINGULAIR) 10 MG tablet take 1 tablet by mouth every evening as needed for allergies. (Patient not taking: Reported on 05/06/2021)   Multiple Vitamins-Minerals (CENTRUM ADULTS PO)    Omega-3 Fatty Acids (FISH OIL) 1000 MG CAPS 1 capsule   rosuvastatin (CRESTOR) 20 MG tablet Take 20 mg by mouth at bedtime.   traZODone (DESYREL) 100 MG tablet Take 100 mg by mouth at bedtime.   vitamin E 180 MG (400 UNITS) capsule Take 400 Units by mouth daily.   No facility-administered encounter medications on file as of 11/10/2021.    ALLERGIES:  Not on File   PHYSICAL EXAM:  ECOG PERFORMANCE STATUS: 0 - Asymptomatic  There were no vitals filed for this visit. There were no vitals filed for this visit. Physical Exam Constitutional:      Appearance: Normal appearance. He is obese.  HENT:     Head: Normocephalic and atraumatic.     Mouth/Throat:     Mouth: Mucous membranes are moist.  Eyes:     Extraocular Movements: Extraocular movements intact.     Pupils: Pupils are equal, round, and reactive to light.  Cardiovascular:     Rate and Rhythm: Normal rate and regular rhythm.     Pulses: Normal pulses.     Heart sounds: Normal heart sounds.  Pulmonary:     Effort: Pulmonary effort is normal.     Breath sounds: Normal breath sounds.  Abdominal:     General: Bowel sounds are normal.     Palpations: Abdomen is soft.     Tenderness: There is no abdominal tenderness.  Musculoskeletal:        General: No  swelling.     Right lower leg: No edema.     Left lower leg: No edema.  Lymphadenopathy:     Cervical: No cervical adenopathy.  Skin:    General: Skin is warm and dry.  Neurological:     General: No focal deficit present.     Mental Status: He is alert and oriented to person, place, and time.  Psychiatric:        Mood and Affect: Mood normal.        Behavior: Behavior normal.     LABORATORY DATA:  I have reviewed the labs as listed.  CBC    Component Value Date/Time   WBC 6.5 09/03/2021  1404   RBC 3.61 (L) 09/03/2021 1404   HGB 12.6 (L) 09/03/2021 1404   HCT 36.9 (L) 09/03/2021 1404   HCT 33.8 (L) 01/20/2021 1416   PLT 144 (L) 09/03/2021 1404   MCV 102.2 (H) 09/03/2021 1404   MCH 34.9 (H) 09/03/2021 1404   MCHC 34.1 09/03/2021 1404   RDW 13.5 09/03/2021 1404   LYMPHSABS 2.3 09/03/2021 1404   MONOABS 0.5 09/03/2021 1404   EOSABS 0.2 09/03/2021 1404   BASOSABS 0.0 09/03/2021 1404   CMP Latest Ref Rng & Units 09/03/2021 04/29/2021  Glucose 70 - 99 mg/dL 104(H) 106(H)  BUN 8 - 23 mg/dL 19 21  Creatinine 0.61 - 1.24 mg/dL 0.91 1.01  Sodium 135 - 145 mmol/L 137 139  Potassium 3.5 - 5.1 mmol/L 4.3 3.8  Chloride 98 - 111 mmol/L 103 107  CO2 22 - 32 mmol/L 26 25  Calcium 8.9 - 10.3 mg/dL 9.1 9.0  Total Protein 6.5 - 8.1 g/dL 7.2 6.8  Total Bilirubin 0.3 - 1.2 mg/dL 1.0 0.6  Alkaline Phos 38 - 126 U/L 69 60  AST 15 - 41 U/L 27 26  ALT 0 - 44 U/L 22 18    DIAGNOSTIC IMAGING:  I have independently reviewed the relevant imaging and discussed with the patient.  ASSESSMENT & PLAN: 1.  Macrocytic anemia - Normal K59 and folic acid levels, hepatitis panel nonreactive, no evidence of hemolysis or monoclonal protein.  TSH normal. - Reticulocyte index of 1.08 indicates hypoproliferation - Abdominal ultrasound (11/03/2021): No apparent liver or spleen abnormalities. - Patient also has history of alcohol consumption, but has stopped drinking entirely since 01/10/2021 - No B  symptoms or complaints of blood loss  - Most recent CBC (11/10/2021): Hgb 12.9/MCV 104.8, platelets 132.  Normal WBC and differential. - Differential diagnosis includes early MDS - Patient opted to observe for now, but will consider bone marrow biopsy aspiration in the future if there is a decline in blood counts - PLAN:  Repeat labs and RTC in 4 months  - Will readdress possible bone marrow biopsy if any significant progression or deviation from baseline.  2.  Thrombocytopenia, mild - CBC on 04/29/2021 noted mild thrombocytopenia with platelets 134.  Most recent CBC (11/10/2021) shows platelets 132. - Normal X77 and folic acid levels, hepatitis panel nonreactive, no evidence of hemolysis or monoclonal protein - Abdominal ultrasound (11/03/2021): No apparent liver or spleen abnormalities. - Differential diagnosis includes possible early MDS - No abnormal bleeding, bruising, or petechial rash.   - PLAN: No indication for treatment at this time. - Will monitor with repeat CBC in 4 months.  3.  Mild iron deficiency -  Iron panel (09/03/2021) shows marginal ferritin 25, iron saturation 30%, normal TIBC - PLAN: Start ferrous sulfate 325 mg daily.  Recheck iron panel in 4 months.      PLAN SUMMARY & DISPOSITION:  Repeat CBC, CMP, iron panel in 4 months RTC after labs   All questions were answered. The patient knows to call the clinic with any problems, questions or concerns.  Medical decision making: Moderate  Time spent on visit: I spent 20 minutes counseling the patient face to face. The total time spent in the appointment was 30 minutes and more than 50% was on counseling.   Harriett Rush, PA-C  11/10/2021 1:43 PM

## 2021-11-10 ENCOUNTER — Inpatient Hospital Stay (HOSPITAL_COMMUNITY): Payer: Medicare PPO

## 2021-11-10 ENCOUNTER — Other Ambulatory Visit: Payer: Self-pay

## 2021-11-10 ENCOUNTER — Inpatient Hospital Stay (HOSPITAL_COMMUNITY): Payer: Medicare PPO | Attending: Physician Assistant | Admitting: Physician Assistant

## 2021-11-10 VITALS — BP 124/73 | HR 78 | Temp 98.2°F | Resp 18 | Ht 62.99 in | Wt 166.7 lb

## 2021-11-10 DIAGNOSIS — E611 Iron deficiency: Secondary | ICD-10-CM

## 2021-11-10 DIAGNOSIS — I10 Essential (primary) hypertension: Secondary | ICD-10-CM | POA: Insufficient documentation

## 2021-11-10 DIAGNOSIS — D539 Nutritional anemia, unspecified: Secondary | ICD-10-CM | POA: Diagnosis present

## 2021-11-10 DIAGNOSIS — Z801 Family history of malignant neoplasm of trachea, bronchus and lung: Secondary | ICD-10-CM | POA: Insufficient documentation

## 2021-11-10 DIAGNOSIS — Z87891 Personal history of nicotine dependence: Secondary | ICD-10-CM | POA: Diagnosis not present

## 2021-11-10 DIAGNOSIS — Z808 Family history of malignant neoplasm of other organs or systems: Secondary | ICD-10-CM | POA: Diagnosis not present

## 2021-11-10 DIAGNOSIS — D696 Thrombocytopenia, unspecified: Secondary | ICD-10-CM

## 2021-11-10 LAB — CBC WITH DIFFERENTIAL/PLATELET
Abs Immature Granulocytes: 0.05 10*3/uL (ref 0.00–0.07)
Basophils Absolute: 0 10*3/uL (ref 0.0–0.1)
Basophils Relative: 1 %
Eosinophils Absolute: 0.2 10*3/uL (ref 0.0–0.5)
Eosinophils Relative: 2 %
HCT: 37.2 % — ABNORMAL LOW (ref 39.0–52.0)
Hemoglobin: 12.9 g/dL — ABNORMAL LOW (ref 13.0–17.0)
Immature Granulocytes: 1 %
Lymphocytes Relative: 18 %
Lymphs Abs: 1.5 10*3/uL (ref 0.7–4.0)
MCH: 36.3 pg — ABNORMAL HIGH (ref 26.0–34.0)
MCHC: 34.7 g/dL (ref 30.0–36.0)
MCV: 104.8 fL — ABNORMAL HIGH (ref 80.0–100.0)
Monocytes Absolute: 0.9 10*3/uL (ref 0.1–1.0)
Monocytes Relative: 11 %
Neutro Abs: 5.6 10*3/uL (ref 1.7–7.7)
Neutrophils Relative %: 67 %
Platelets: 132 10*3/uL — ABNORMAL LOW (ref 150–400)
RBC: 3.55 MIL/uL — ABNORMAL LOW (ref 4.22–5.81)
RDW: 13.1 % (ref 11.5–15.5)
WBC: 8.3 10*3/uL (ref 4.0–10.5)
nRBC: 0 % (ref 0.0–0.2)

## 2021-11-10 LAB — IRON AND TIBC
Iron: 56 ug/dL (ref 45–182)
Saturation Ratios: 15 % — ABNORMAL LOW (ref 17.9–39.5)
TIBC: 362 ug/dL (ref 250–450)
UIBC: 306 ug/dL

## 2021-11-10 LAB — FERRITIN: Ferritin: 52 ng/mL (ref 24–336)

## 2021-11-10 NOTE — Patient Instructions (Signed)
[place] at [facility] Discharge Instructions  You were seen today by Tarri Abernethy PA-C for your anemia.    It is possible that your blood and platelets abnormalities may be due to a type of low-grade bone marrow cancer called MDS (myelodysplastic syndrome).  At this time, your blood abnormalities are very mild and do not require any treatment.  We will continue to watch and monitor them.  If we see any major changes in the future, we will discuss the need for a possible bone marrow biopsy.  Your iron levels are sightly low.  Please start taking on over-the-counter iron supplement once daily in the morning.  Take it with a glass of orange juice.  Use a stool softener if it causes constipation.   Continue to stay away from drinking alcohol, as this can also be toxic to your bone marrow and cause many other health conditions.  LABS: Return in 4 months for repeat labs  OTHER TESTS: None  FOLLOW-UP APPOINTMENT: Office visit in 4 months, about a week after your labs are checked   Thank you for choosing  at [facility] to provide your oncology and hematology care.  To afford each patient quality time with our provider, please arrive at least 15 minutes before your scheduled appointment time.   If you have a lab appointment with the [facility] please come in thru the Main Entrance and check in at the main information desk.  You need to re-schedule your appointment should you arrive 10 or more minutes late.  We strive to give you quality time with our providers, and arriving late affects you and other patients whose appointments are after yours.  Also, if you no show three or more times for appointments you may be dismissed from the clinic at the providers discretion.     Again, thank you for choosing [facility].  Our hope is that these requests will decrease the amount of time that you wait before being seen by our  physicians.       _____________________________________________________________  Should you have questions after your visit to [facility], please contact our office at [phone] and follow the prompts.  Our office hours are 8:00 a.m. and 4:30 p.m. Monday - Friday.  Please note that voicemails left after 4:00 p.m. may not be returned until the following business day.  We are closed weekends and major holidays.  You do have access to a nurse 24-7, just call the main number to the clinic [phone] and do not press any options, hold on the line and a nurse will answer the phone.    For prescription refill requests, have your pharmacy contact our office and allow 72 hours.    Due to Covid, you will need to wear a mask upon entering the hospital. If you do not have a mask, a mask will be given to you at the Main Entrance upon arrival. For doctor visits, patients may have 1 support person age 42 or older with them. For treatment visits, patients can not have anyone with them due to social distancing guidelines and our immunocompromised population.

## 2022-03-03 ENCOUNTER — Inpatient Hospital Stay (HOSPITAL_COMMUNITY): Payer: Medicare PPO | Attending: Hematology

## 2022-03-03 DIAGNOSIS — E611 Iron deficiency: Secondary | ICD-10-CM

## 2022-03-03 DIAGNOSIS — D539 Nutritional anemia, unspecified: Secondary | ICD-10-CM

## 2022-03-03 DIAGNOSIS — Z87891 Personal history of nicotine dependence: Secondary | ICD-10-CM | POA: Diagnosis not present

## 2022-03-03 DIAGNOSIS — D696 Thrombocytopenia, unspecified: Secondary | ICD-10-CM

## 2022-03-03 DIAGNOSIS — I10 Essential (primary) hypertension: Secondary | ICD-10-CM | POA: Insufficient documentation

## 2022-03-03 LAB — CBC WITH DIFFERENTIAL/PLATELET
Abs Immature Granulocytes: 0.05 10*3/uL (ref 0.00–0.07)
Basophils Absolute: 0 10*3/uL (ref 0.0–0.1)
Basophils Relative: 0 %
Eosinophils Absolute: 0.4 10*3/uL (ref 0.0–0.5)
Eosinophils Relative: 5 %
HCT: 34.6 % — ABNORMAL LOW (ref 39.0–52.0)
Hemoglobin: 12 g/dL — ABNORMAL LOW (ref 13.0–17.0)
Immature Granulocytes: 1 %
Lymphocytes Relative: 35 %
Lymphs Abs: 2.5 10*3/uL (ref 0.7–4.0)
MCH: 35.2 pg — ABNORMAL HIGH (ref 26.0–34.0)
MCHC: 34.7 g/dL (ref 30.0–36.0)
MCV: 101.5 fL — ABNORMAL HIGH (ref 80.0–100.0)
Monocytes Absolute: 0.6 10*3/uL (ref 0.1–1.0)
Monocytes Relative: 8 %
Neutro Abs: 3.8 10*3/uL (ref 1.7–7.7)
Neutrophils Relative %: 51 %
Platelets: 157 10*3/uL (ref 150–400)
RBC: 3.41 MIL/uL — ABNORMAL LOW (ref 4.22–5.81)
RDW: 13.2 % (ref 11.5–15.5)
WBC: 7.3 10*3/uL (ref 4.0–10.5)
nRBC: 0 % (ref 0.0–0.2)

## 2022-03-03 LAB — COMPREHENSIVE METABOLIC PANEL
ALT: 21 U/L (ref 0–44)
AST: 25 U/L (ref 15–41)
Albumin: 4.3 g/dL (ref 3.5–5.0)
Alkaline Phosphatase: 62 U/L (ref 38–126)
Anion gap: 5 (ref 5–15)
BUN: 20 mg/dL (ref 8–23)
CO2: 25 mmol/L (ref 22–32)
Calcium: 9.1 mg/dL (ref 8.9–10.3)
Chloride: 108 mmol/L (ref 98–111)
Creatinine, Ser: 1.13 mg/dL (ref 0.61–1.24)
GFR, Estimated: >60 mL/min (ref >60)
Glucose, Bld: 101 mg/dL — ABNORMAL HIGH (ref 70–99)
Potassium: 4.4 mmol/L (ref 3.5–5.1)
Sodium: 138 mmol/L (ref 135–145)
Total Bilirubin: 0.4 mg/dL (ref 0.3–1.2)
Total Protein: 7.2 g/dL (ref 6.5–8.1)

## 2022-03-03 LAB — IRON AND TIBC
Iron: 71 ug/dL (ref 45–182)
Saturation Ratios: 21 % (ref 17.9–39.5)
TIBC: 341 ug/dL (ref 250–450)
UIBC: 270 ug/dL

## 2022-03-03 LAB — FERRITIN: Ferritin: 45 ng/mL (ref 24–336)

## 2022-03-03 LAB — LACTATE DEHYDROGENASE: LDH: 165 U/L (ref 98–192)

## 2022-03-09 NOTE — Progress Notes (Unsigned)
Timothy Flores, Timothy Flores [id number]   CLINIC:  Medical Oncology/Hematology  PCP:  Vidal Schwalbe, MD [address] [place] [address] [phone]   REASON FOR VISIT:  Follow-up for macrocytic anemia   PRIOR THERAPY: None   CURRENT THERAPY: Observation   INTERVAL HISTORY:  Timothy Flores 71 y.o. male returns for routine follow-up of macrocytic anemia and thrombocytopenia.  He was last seen by Tarri Abernethy, PA-C on 05/06/2021.   At today's visit, he reports feeling well.  No recent hospitalizations, surgeries, or changes in baseline health status.   He admits to easy bruising, but denies any petechial rash.   No signs of bleeding such as epistaxis, gum bleeding, hematuria, hematemesis, hematochezia, or melena.   He denies any B symptoms such as fever, chills, night sweats, unintentional weight loss.   As previously noted, he stopped drinking alcohol in March 2022, and has not started that again. He is taking iron pill daily without any adverse effects.  He has 100% energy and 100% appetite. He endorses that he is maintaining a stable weight.   REVIEW OF SYSTEMS:  Review of Systems  Constitutional:  Negative for appetite change, chills, diaphoresis, fatigue, fever and unexpected weight change.  HENT:   Negative for lump/mass and nosebleeds.   Eyes:  Negative for eye problems.  Respiratory:  Negative for cough, hemoptysis and shortness of breath.   Cardiovascular:  Negative for chest pain, leg swelling and palpitations.  Gastrointestinal:  Negative for abdominal pain, blood in stool, constipation, diarrhea, nausea and vomiting.  Genitourinary:  Negative for hematuria.   Skin: Negative.   Neurological:  Negative for dizziness, headaches and light-headedness.  Hematological:  Does not bruise/bleed easily.     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   Hypertension     Past Surgical History:  Procedure Laterality Date   cancer removal     oral cancer 2013     SOCIAL HISTORY:  Social History   Socioeconomic History   Marital status: Married    Spouse name: Not on file   Number of children: 2   Years of education: Not on file   Highest education level: Not on file  Occupational History   Occupation: [employer]   Occupation: retired   Occupation: Nature conservation officer  Tobacco Use   Smoking status: Former    Years: 50.00    Types: Cigarettes   Smokeless tobacco: Former  Substance and Sexual Activity   Alcohol use: Not Currently   Drug use: Not Currently   Sexual activity: Not Currently  Other Topics Concern   Not on file  Social History Narrative   Not on file   Social Determinants of Health   Financial Resource Strain: Not on file  Food Insecurity: Not on file  Transportation Needs: Not on file  Physical Activity: Not on file  Stress: Not on file  Social Connections: Not on file  Intimate Partner Violence: Not on file    FAMILY HISTORY:  Family History  Problem Relation Age of Onset   Stroke Mother    Cancer Paternal Uncle        lung cancer   Cancer Cousin        brain cancer    CURRENT MEDICATIONS:  Outpatient Encounter Medications as of 03/10/2022  Medication Sig   allopurinol (ZYLOPRIM) 300 MG tablet Take 300 mg by mouth daily.  Apple Cid Vn-Grn Tea-Bit Or-Cr (APPLE CIDER VINEGAR PLUS) TABS See admin instructions.   Ascorbic Acid (VITAMIN C) 1000 MG tablet 1 tablet   cetirizine (ZYRTEC) 10 MG tablet Take 10 mg by mouth daily. (Patient not taking: Reported on 11/10/2021)   cholecalciferol (VITAMIN D) 25 MCG (1000 UNIT) tablet 1 tablet   Cyanocobalamin (VITAMIN B12) 1000 MCG TBCR 1 tablet   lisinopril (ZESTRIL) 20 MG tablet Take 20 mg by mouth daily.   montelukast (SINGULAIR) 10 MG tablet    Multiple Vitamins-Minerals (CENTRUM ADULTS PO)    Omega-3 Fatty Acids (FISH OIL) 1000 MG CAPS 1 capsule    rosuvastatin (CRESTOR) 20 MG tablet Take 20 mg by mouth at bedtime.   traZODone (DESYREL) 100 MG tablet Take 100 mg by mouth at bedtime.   vitamin E 180 MG (400 UNITS) capsule Take 400 Units by mouth daily.   No facility-administered encounter medications on file as of 03/10/2022.    ALLERGIES:  Not on File   PHYSICAL EXAM:  ECOG PERFORMANCE STATUS: 0 - Asymptomatic  There were no vitals filed for this visit. There were no vitals filed for this visit. Physical Exam Constitutional:      Appearance: Normal appearance. He is obese.  HENT:     Head: Normocephalic and atraumatic.     Mouth/Throat:     Mouth: Mucous membranes are moist.  Eyes:     Extraocular Movements: Extraocular movements intact.     Pupils: Pupils are equal, round, and reactive to light.  Cardiovascular:     Rate and Rhythm: Normal rate and regular rhythm.     Pulses: Normal pulses.     Heart sounds: Normal heart sounds.  Pulmonary:     Effort: Pulmonary effort is normal.     Breath sounds: Normal breath sounds.  Abdominal:     General: Bowel sounds are normal.     Palpations: Abdomen is soft.     Tenderness: There is no abdominal tenderness.  Musculoskeletal:        General: No swelling.     Right lower leg: No edema.     Left lower leg: No edema.  Lymphadenopathy:     Cervical: No cervical adenopathy.  Skin:    General: Skin is warm and dry.  Neurological:     General: No focal deficit present.     Mental Status: He is alert and oriented to person, place, and time.  Psychiatric:        Mood and Affect: Mood normal.        Behavior: Behavior normal.     LABORATORY DATA:  I have reviewed the labs as listed.  CBC    Component Value Date/Time   WBC 7.3 03/03/2022 1355   RBC 3.41 (L) 03/03/2022 1355   HGB 12.0 (L) 03/03/2022 1355   HCT 34.6 (L) 03/03/2022 1355   HCT 33.8 (L) 01/20/2021 1416   PLT 157 03/03/2022 1355   MCV 101.5 (H) 03/03/2022 1355   MCH 35.2 (H) 03/03/2022 1355   MCHC 34.7  03/03/2022 1355   RDW 13.2 03/03/2022 1355   LYMPHSABS 2.5 03/03/2022 1355   MONOABS 0.6 03/03/2022 1355   EOSABS 0.4 03/03/2022 1355   BASOSABS 0.0 03/03/2022 1355      Latest Ref Rng & Units 03/03/2022    1:55 PM 09/03/2021    2:04 PM 04/29/2021    2:19 PM  CMP  Glucose 70 - 99 mg/dL 101   104   106    BUN  8 - 23 mg/dL 20   19   21     Creatinine 0.61 - 1.24 mg/dL 1.13   0.91   1.01    Sodium 135 - 145 mmol/L 138   137   139    Potassium 3.5 - 5.1 mmol/L 4.4   4.3   3.8    Chloride 98 - 111 mmol/L 108   103   107    CO2 22 - 32 mmol/L 25   26   25     Calcium 8.9 - 10.3 mg/dL 9.1   9.1   9.0    Total Protein 6.5 - 8.1 g/dL 7.2   7.2   6.8    Total Bilirubin 0.3 - 1.2 mg/dL 0.4   1.0   0.6    Alkaline Phos 38 - 126 U/L 62   69   60    AST 15 - 41 U/L 25   27   26     ALT 0 - 44 U/L 21   22   18       DIAGNOSTIC IMAGING:  I have independently reviewed the relevant imaging and discussed with the patient.  ASSESSMENT & PLAN: 1.  Macrocytic anemia - Normal Z99 and folic acid levels, hepatitis panel nonreactive, no evidence of hemolysis or monoclonal protein.  TSH normal. - Reticulocyte index of 1.08 indicates hypoproliferation - Abdominal ultrasound (11/03/2021): No apparent liver or spleen abnormalities. - Patient also has history of alcohol consumption, but has stopped drinking entirely since 01/10/2021 - No B symptoms or complaints of blood loss  - Most recent labs (03/03/2022): Hgb 12.0/MCV 101.5, platelets 157.  Normal WBC and differential.  - Differential diagnosis includes early MDS - Patient opted to observe for now, but will consider bone marrow biopsy aspiration in the future if there is a decline in blood counts  - PLAN:  Repeat labs and RTC in 1 year - Will readdress possible bone marrow biopsy if any significant progression or deviation from baseline.   2.  Thrombocytopenia, mild - CBC on 04/29/2021 noted mild thrombocytopenia with platelets 134.  Most recent CBC  (03/03/2022) shows normal platelets 157 - Normal J57 and folic acid levels, hepatitis panel nonreactive, no evidence of hemolysis or monoclonal protein - Abdominal ultrasound (11/03/2021): No apparent liver or spleen abnormalities. - Differential diagnosis includes possible early MDS  - No abnormal bleeding, bruising, or petechial rash.   - PLAN: No indication for treatment at this time. - Will monitor with repeat CBC 1 year.  3.  Mild iron deficiency -  Iron panel (09/03/2021) showed marginal ferritin 25, iron saturation 30%, normal TIBC - Was started on iron supplementation at last visit, has been taking for the past 4 months - Most recent iron panel is stable with ferritin 45 and iron saturation 21% - PLAN: Continue ferrous sulfate 325 mg daily.  Recheck iron panel in 1 year.       All questions were answered. The patient knows to call the clinic with any problems, questions or concerns.  Medical decision making: Low  Time spent on visit: I spent 15 minutes counseling the patient face to face. The total time spent in the appointment was 22 minutes and more than 50% was on counseling.   Harriett Rush, PA-C  03/10/22 1:22 PM

## 2022-03-10 ENCOUNTER — Inpatient Hospital Stay (HOSPITAL_BASED_OUTPATIENT_CLINIC_OR_DEPARTMENT_OTHER): Payer: Medicare PPO | Admitting: Physician Assistant

## 2022-03-10 ENCOUNTER — Encounter (HOSPITAL_COMMUNITY): Payer: Self-pay | Admitting: Physician Assistant

## 2022-03-10 VITALS — BP 124/64 | HR 64 | Temp 97.8°F | Resp 18 | Wt 169.0 lb

## 2022-03-10 DIAGNOSIS — D539 Nutritional anemia, unspecified: Secondary | ICD-10-CM

## 2022-03-10 DIAGNOSIS — E611 Iron deficiency: Secondary | ICD-10-CM | POA: Diagnosis not present

## 2022-03-10 DIAGNOSIS — D696 Thrombocytopenia, unspecified: Secondary | ICD-10-CM

## 2022-03-10 NOTE — Patient Instructions (Signed)
[place] at [facility] Discharge Instructions  You were seen today by Tarri Abernethy PA-C for your anemia.    It is possible that your blood and platelets abnormalities may be due to a type of low-grade bone marrow cancer called MDS (myelodysplastic syndrome).  At this time, your blood abnormalities are very mild and do not require any treatment.  We will continue to watch and monitor them.  If we see any major changes in the future, we will discuss the need for a possible bone marrow biopsy.  Continue taking on over-the-counter iron supplement once daily in the morning.  Take it with a glass of orange juice.  Use a stool softener if it causes constipation.   Continue to stay away from drinking alcohol, as this can also be toxic to your bone marrow and cause many other health conditions.  LABS: Return in 1 year for repeat labs  OTHER TESTS: None  FOLLOW-UP APPOINTMENT: Office visit in 1 year, about a week after your labs are checked   Thank you for choosing [place] at [facility] to provide your oncology and hematology care.  To afford each patient quality time with our provider, please arrive at least 15 minutes before your scheduled appointment time.   If you have a lab appointment with the [facility] please come in thru the Main Entrance and check in at the main information desk.  You need to re-schedule your appointment should you arrive 10 or more minutes late.  We strive to give you quality time with our providers, and arriving late affects you and other patients whose appointments are after yours.  Also, if you no show three or more times for appointments you may be dismissed from the clinic at the providers discretion.     Again, thank you for choosing [facility].  Our hope is that these requests will decrease the amount of time that you wait before being seen by our physicians.        _____________________________________________________________  Should you have questions after your visit to [facility], please contact our office at [phone] and follow the prompts.  Our office hours are 8:00 a.m. and 4:30 p.m. Monday - Friday.  Please note that voicemails left after 4:00 p.m. may not be returned until the following business day.  We are closed weekends and major holidays.  You do have access to a nurse 24-7, just call the main number to the clinic [phone] and do not press any options, hold on the line and a nurse will answer the phone.    For prescription refill requests, have your pharmacy contact our office and allow 72 hours.    Due to Covid, you will need to wear a mask upon entering the hospital. If you do not have a mask, a mask will be given to you at the Main Entrance upon arrival. For doctor visits, patients may have 1 support person age 40 or older with them. For treatment visits, patients can not have anyone with them due to social distancing guidelines and our immunocompromised population.

## 2022-12-27 IMAGING — US US ABDOMEN COMPLETE
1 series · 14 of 25 positions shown · non-contrast
Comparison: None.

CLINICAL DATA: Anemia, thrombocytopenia

EXAM:
ABDOMEN ULTRASOUND COMPLETE

[Series 1: us abdomen complete · 14 of 99 slices shown]
[im 1/99]
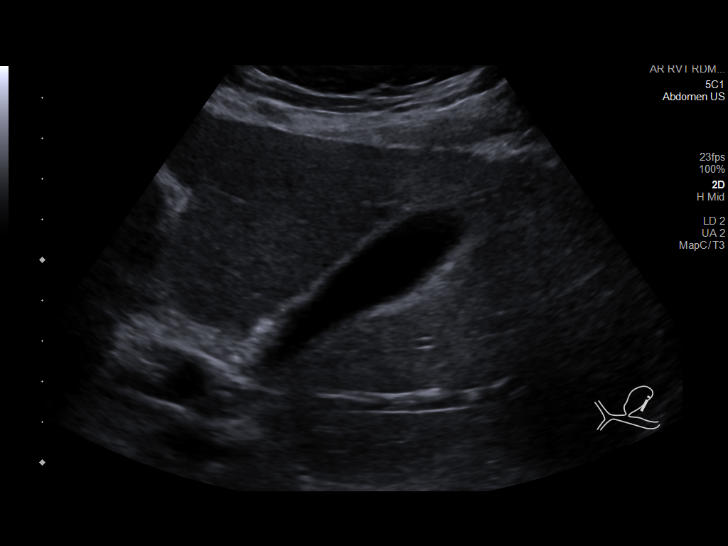
[im 9/99]
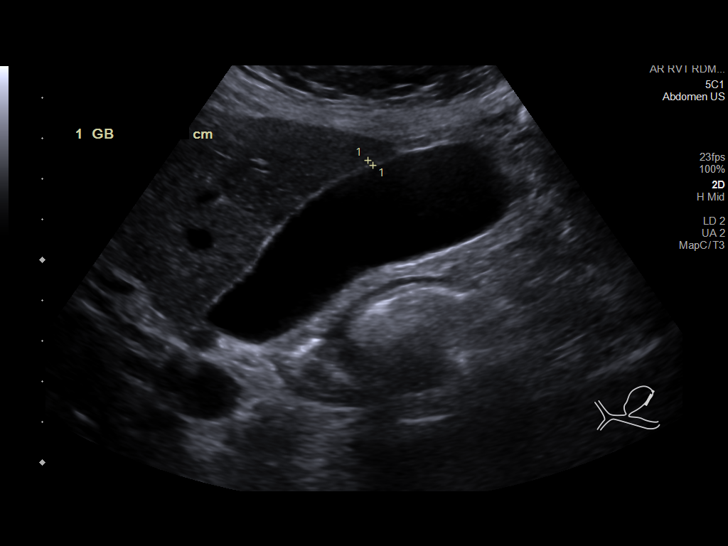
[im 17/99]
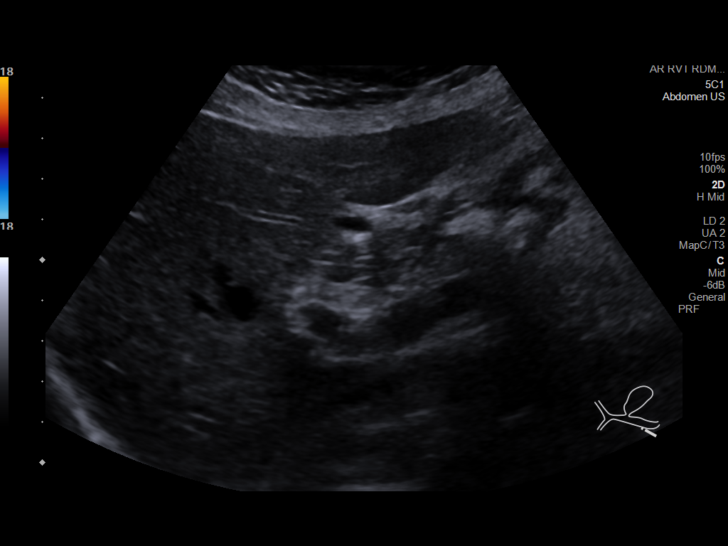
[im 25/99]
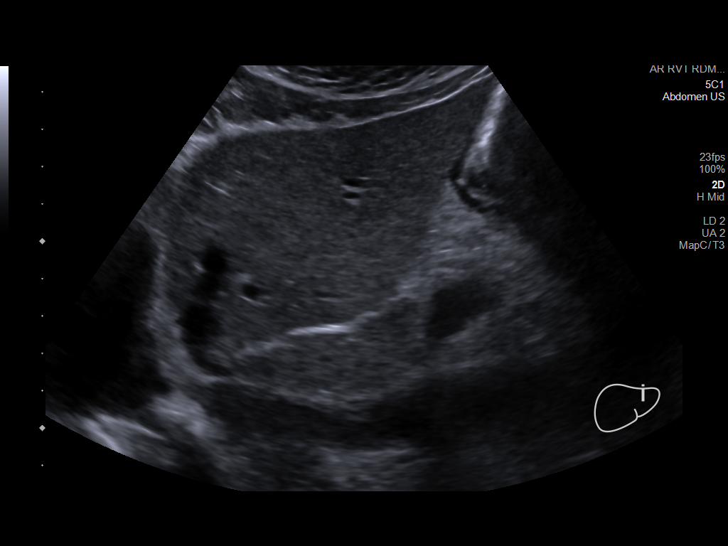
[im 33/99]
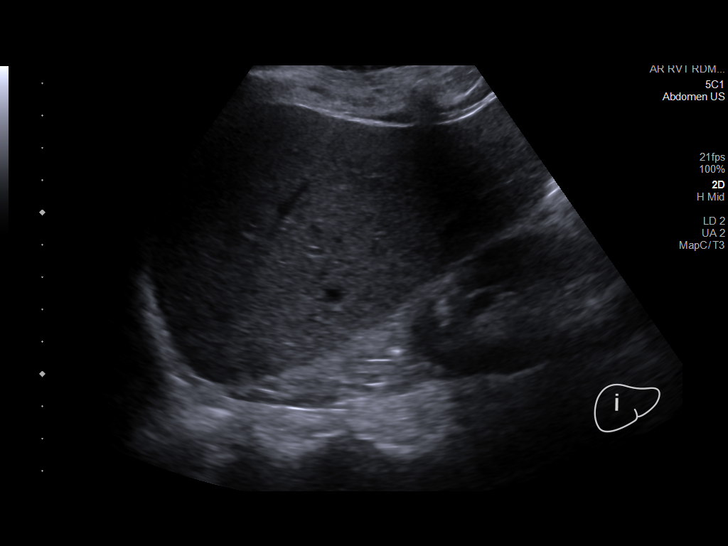
[im 37/99]
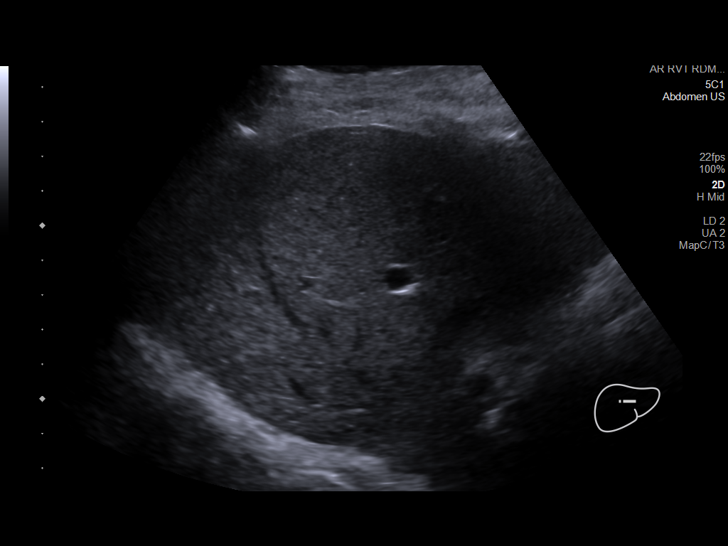
[im 45/99]
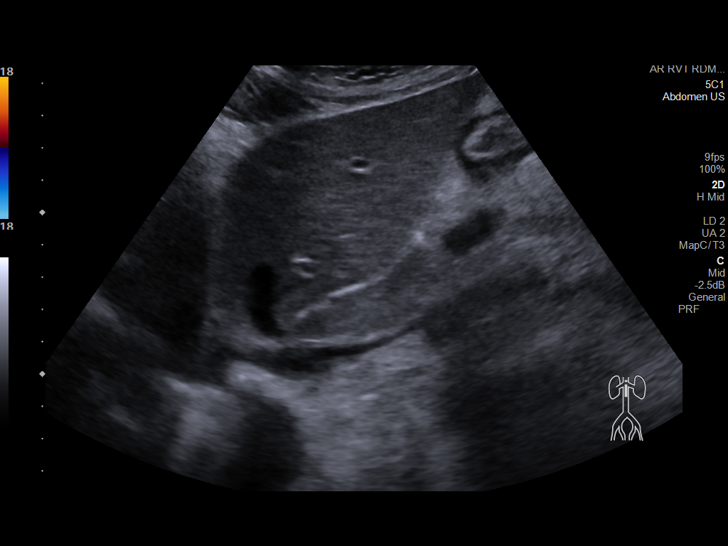
[im 54/99]
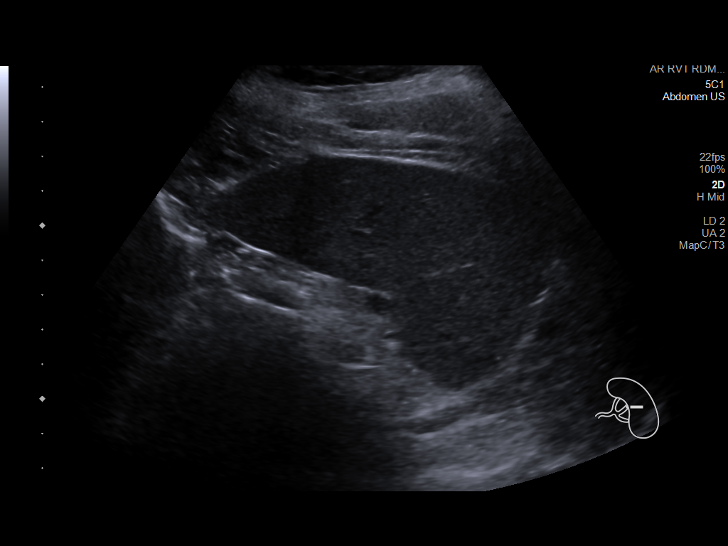
[im 62/99]
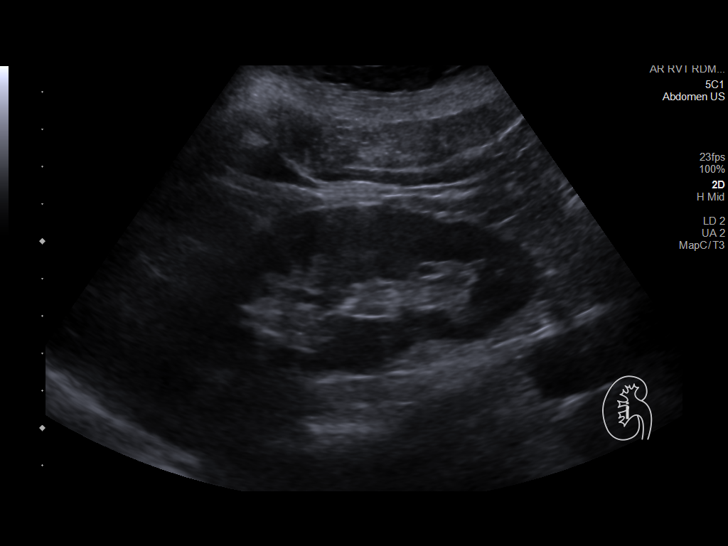
[im 66/99]
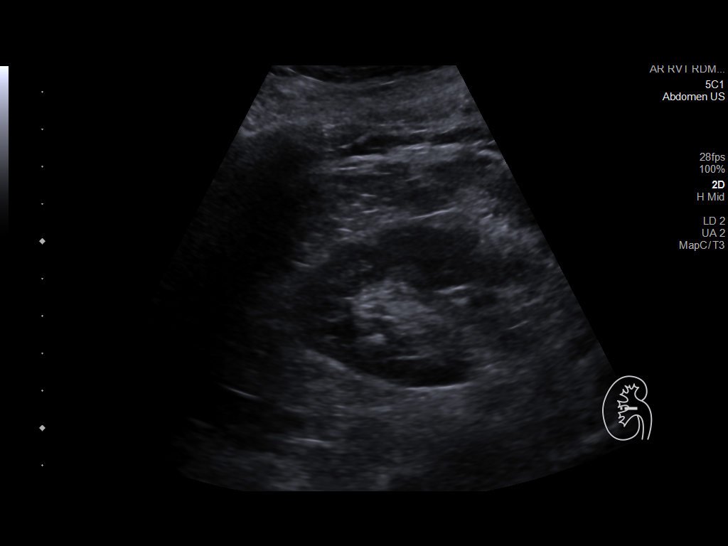
[im 74/99]
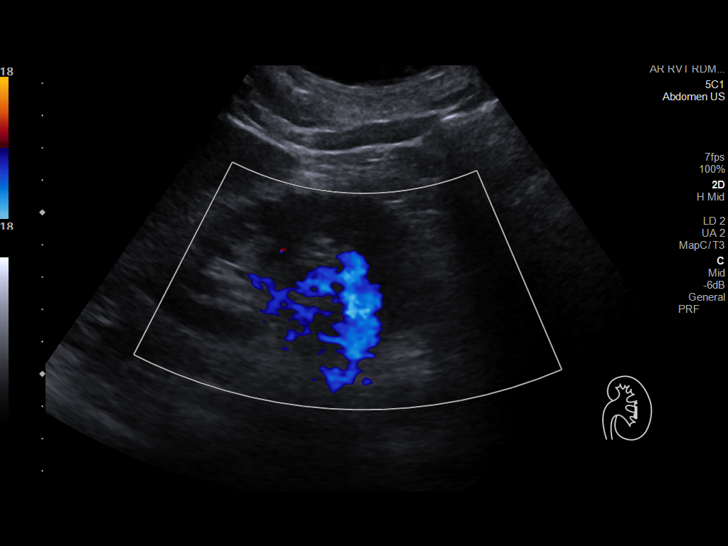
[im 82/99]
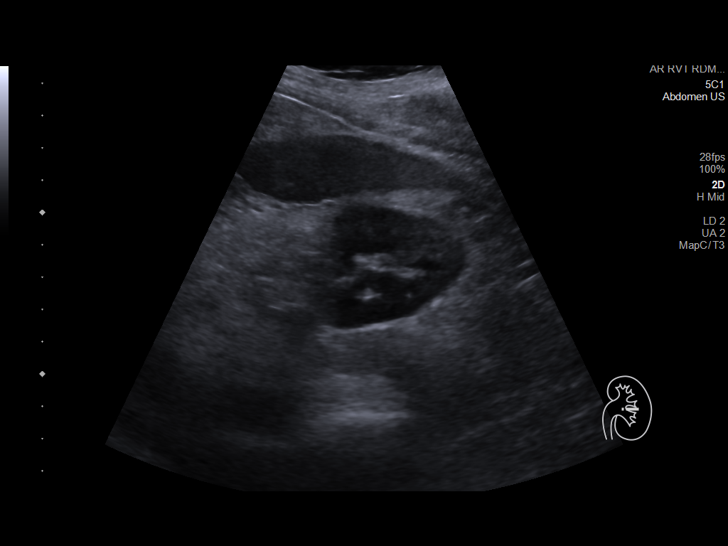
[im 90/99]
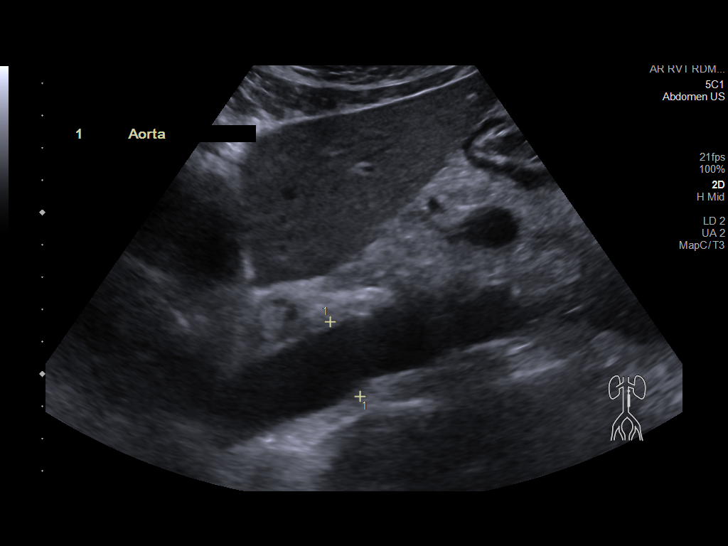
[im 99/99]
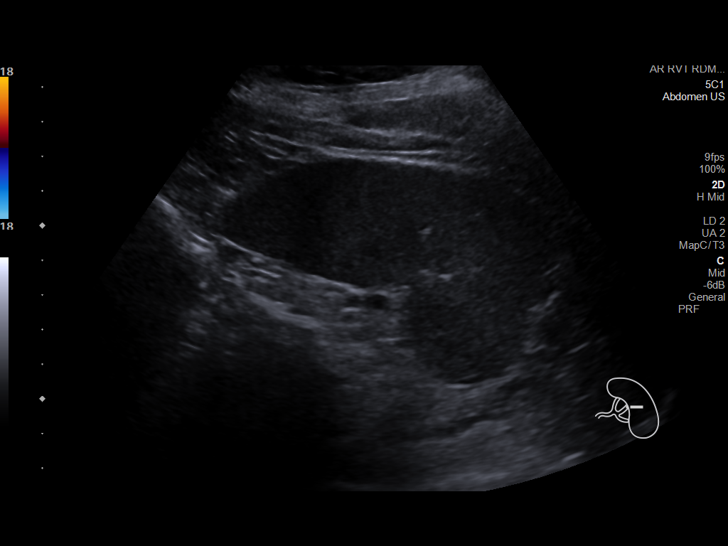

[14 of 25 positions shown; findings below may reference images not displayed]

FINDINGS: Gallbladder: No gallstones or wall thickening visualized. No
sonographic Murphy sign noted by sonographer.

Common bile duct: Diameter: 2 mm

Liver: No focal lesion identified. Within normal limits in
parenchymal echogenicity. Portal vein is patent on color Doppler
imaging with normal direction of blood flow towards the liver.

IVC: No abnormality visualized.

Pancreas: Visualized portion unremarkable.

Spleen: Size and appearance within normal limits.

Right Kidney: Length: 9.9 cm. Echogenicity within normal limits. No
mass or hydronephrosis visualized.

Left Kidney: Length: 10 cm. Echogenicity within normal limits. No
mass or hydronephrosis visualized.

Abdominal aorta: No aneurysm visualized.

Other findings: None.
IMPRESSION: Unremarkable sonogram of the abdomen.

## 2023-03-02 ENCOUNTER — Inpatient Hospital Stay: Payer: Medicare PPO | Attending: Physician Assistant

## 2023-03-02 DIAGNOSIS — Z87891 Personal history of nicotine dependence: Secondary | ICD-10-CM | POA: Insufficient documentation

## 2023-03-02 DIAGNOSIS — Z808 Family history of malignant neoplasm of other organs or systems: Secondary | ICD-10-CM | POA: Insufficient documentation

## 2023-03-02 DIAGNOSIS — D539 Nutritional anemia, unspecified: Secondary | ICD-10-CM | POA: Insufficient documentation

## 2023-03-02 DIAGNOSIS — E611 Iron deficiency: Secondary | ICD-10-CM | POA: Insufficient documentation

## 2023-03-02 DIAGNOSIS — Z801 Family history of malignant neoplasm of trachea, bronchus and lung: Secondary | ICD-10-CM | POA: Insufficient documentation

## 2023-03-02 DIAGNOSIS — D696 Thrombocytopenia, unspecified: Secondary | ICD-10-CM | POA: Insufficient documentation

## 2023-03-09 ENCOUNTER — Inpatient Hospital Stay: Payer: Medicare PPO

## 2023-03-09 ENCOUNTER — Inpatient Hospital Stay: Payer: Medicare PPO | Admitting: Physician Assistant

## 2023-03-09 DIAGNOSIS — E611 Iron deficiency: Secondary | ICD-10-CM

## 2023-03-09 DIAGNOSIS — Z808 Family history of malignant neoplasm of other organs or systems: Secondary | ICD-10-CM | POA: Diagnosis not present

## 2023-03-09 DIAGNOSIS — D539 Nutritional anemia, unspecified: Secondary | ICD-10-CM | POA: Diagnosis present

## 2023-03-09 DIAGNOSIS — Z87891 Personal history of nicotine dependence: Secondary | ICD-10-CM | POA: Diagnosis not present

## 2023-03-09 DIAGNOSIS — D696 Thrombocytopenia, unspecified: Secondary | ICD-10-CM | POA: Diagnosis not present

## 2023-03-09 DIAGNOSIS — Z801 Family history of malignant neoplasm of trachea, bronchus and lung: Secondary | ICD-10-CM | POA: Diagnosis not present

## 2023-03-09 LAB — IRON AND TIBC
Iron: 68 ug/dL (ref 45–182)
Saturation Ratios: 24 % (ref 17.9–39.5)
TIBC: 287 ug/dL (ref 250–450)
UIBC: 219 ug/dL

## 2023-03-09 LAB — CBC WITH DIFFERENTIAL/PLATELET
Abs Immature Granulocytes: 0.08 10*3/uL — ABNORMAL HIGH (ref 0.00–0.07)
Basophils Absolute: 0 10*3/uL (ref 0.0–0.1)
Basophils Relative: 1 %
Eosinophils Absolute: 0.3 10*3/uL (ref 0.0–0.5)
Eosinophils Relative: 4 %
HCT: 33.7 % — ABNORMAL LOW (ref 39.0–52.0)
Hemoglobin: 11.5 g/dL — ABNORMAL LOW (ref 13.0–17.0)
Immature Granulocytes: 1 %
Lymphocytes Relative: 30 %
Lymphs Abs: 2 10*3/uL (ref 0.7–4.0)
MCH: 35.3 pg — ABNORMAL HIGH (ref 26.0–34.0)
MCHC: 34.1 g/dL (ref 30.0–36.0)
MCV: 103.4 fL — ABNORMAL HIGH (ref 80.0–100.0)
Monocytes Absolute: 0.7 10*3/uL (ref 0.1–1.0)
Monocytes Relative: 11 %
Neutro Abs: 3.7 10*3/uL (ref 1.7–7.7)
Neutrophils Relative %: 53 %
Platelets: 131 10*3/uL — ABNORMAL LOW (ref 150–400)
RBC: 3.26 MIL/uL — ABNORMAL LOW (ref 4.22–5.81)
RDW: 13.2 % (ref 11.5–15.5)
WBC: 6.8 10*3/uL (ref 4.0–10.5)
nRBC: 0 % (ref 0.0–0.2)

## 2023-03-09 LAB — COMPREHENSIVE METABOLIC PANEL
ALT: 22 U/L (ref 0–44)
AST: 24 U/L (ref 15–41)
Albumin: 3.9 g/dL (ref 3.5–5.0)
Alkaline Phosphatase: 59 U/L (ref 38–126)
Anion gap: 9 (ref 5–15)
BUN: 23 mg/dL (ref 8–23)
CO2: 24 mmol/L (ref 22–32)
Calcium: 9.1 mg/dL (ref 8.9–10.3)
Chloride: 105 mmol/L (ref 98–111)
Creatinine, Ser: 1.16 mg/dL (ref 0.61–1.24)
GFR, Estimated: >60 mL/min (ref >60)
Glucose, Bld: 104 mg/dL — ABNORMAL HIGH (ref 70–99)
Potassium: 4 mmol/L (ref 3.5–5.1)
Sodium: 138 mmol/L (ref 135–145)
Total Bilirubin: 0.3 mg/dL (ref 0.3–1.2)
Total Protein: 7 g/dL (ref 6.5–8.1)

## 2023-03-09 LAB — LACTATE DEHYDROGENASE: LDH: 150 U/L (ref 98–192)

## 2023-03-09 LAB — FERRITIN: Ferritin: 81 ng/mL (ref 24–336)

## 2023-03-09 LAB — VITAMIN B12: Vitamin B-12: 958 pg/mL — ABNORMAL HIGH (ref 180–914)

## 2023-03-11 LAB — METHYLMALONIC ACID, SERUM: Methylmalonic Acid, Quantitative: 269 nmol/L (ref 0–378)

## 2023-03-12 NOTE — Progress Notes (Unsigned)
[facility] [address], [place] [address]   CLINIC:  Medical Oncology/Hematology  PCP:  Smith Robert, MD [address] [place] [address] [phone]   REASON FOR VISIT:  Follow-up for macrocytic anemia   PRIOR THERAPY: None   CURRENT THERAPY: Observation  INTERVAL HISTORY:   Mr. Timothy Flores 72 y.o. male returns for routine follow-up of macrocytic anemia and thrombocytopenia.  He was last seen by Rojelio Brenner, PA-C on 03/10/2022.   At today's visit, he reports feeling well.  No recent hospitalizations, surgeries, or changes in baseline health status.   He admits to easy bruising, but denies any petechial rash.  No signs of bleeding such as epistaxis, gum bleeding, hematuria, hematemesis, hematochezia, or melena.  He denies any B symptoms such as fever, chills, night sweats, unintentional weight loss.  As previously noted, he stopped drinking alcohol in March 2022, and has not started that again.  He is taking iron pill daily without any adverse effects.  He has 100% energy and 100% appetite. He endorses that he is maintaining a stable weight.  ASSESSMENT & PLAN:  1.  Macrocytic anemia & mild thrombocytopenia - Normal B12 and folic acid levels, hepatitis panel nonreactive, no evidence of hemolysis or monoclonal protein.  TSH normal. - Reticulocyte index of 1.08 indicates hypoproliferation - Abdominal ultrasound (11/03/2021): No apparent liver or spleen abnormalities. - Patient also has history of alcohol consumption, but has stopped drinking entirely since 01/10/2021 - No B symptoms or complaints of blood loss. No abnormal bleeding, bruising, or petechial rash.   - Most recent labs (03/09/2023): Hgb 11.5/MCV 103.4, platelets 131.  Normal WBC and differential. LDH normal. - Differential diagnosis includes early MDS - Patient opted to observe for now, but will consider bone marrow biopsy aspiration in the future if there is a decline in blood counts   - PLAN: CBC/D only in 6 months. - Labs and office visit in 1 year. - Will readdress possible bone marrow biopsy if any significant progression or deviation from baseline.   2.  Mild iron deficiency -  Iron panel (09/03/2021) showed marginal ferritin 25, iron saturation 30%, normal TIBC - Was started on iron supplementation at last visit, has been taking for the past year - Most recent iron panel (03/09/2023) is stable with ferritin 81 and iron saturation 24% - PLAN: Continue ferrous sulfate 325 mg daily.  Recheck iron panel in 1 year.      PLAN SUMMARY: >> CBC/D only in 6 months >> Labs in 1 year = CBC/D, CMP, LDH, ferritin, iron/TIBC, B12, MMA >> OFFICE visit in 1 year (1 week after labs)     REVIEW OF SYSTEMS:   Review of Systems  Constitutional:  Negative for appetite change, chills, diaphoresis, fatigue, fever and unexpected weight change.  HENT:   Negative for lump/mass and nosebleeds.   Eyes:  Negative for eye problems.  Respiratory:  Negative for cough, hemoptysis and shortness of breath.   Cardiovascular:  Negative for chest pain, leg swelling and palpitations.  Gastrointestinal:  Negative for abdominal pain, blood in stool, constipation, diarrhea, nausea and vomiting.  Genitourinary:  Negative for hematuria.   Skin: Negative.   Neurological:  Negative for dizziness, headaches and light-headedness.  Hematological:  Does not bruise/bleed easily.     PHYSICAL EXAM:  ECOG PERFORMANCE STATUS: 0 - Asymptomatic  There were no vitals filed for this visit. There were no vitals filed for this visit. Physical Exam Constitutional:  Appearance: Normal appearance. He is obese.  Cardiovascular:     Heart sounds: Normal heart sounds.  Pulmonary:     Breath sounds: Normal breath sounds.  Neurological:     General: No focal deficit present.     Mental Status: Mental status is at baseline.  Psychiatric:        Behavior: Behavior normal. Behavior is cooperative.    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   Hypertension    Past Surgical History:  Procedure Laterality Date   cancer removal     oral cancer 2013    SOCIAL HISTORY:  Social History   Socioeconomic History   Marital status: Married    Spouse name: Not on file   Number of children: 2   Years of education: Not on file   Highest education level: Not on file  Occupational History   Occupation: Research scientist (medical) center   Occupation: retired   Occupation: Hotel manager  Tobacco Use   Smoking status: Former    Years: 50    Types: Cigarettes   Smokeless tobacco: Former  Substance and Sexual Activity   Alcohol use: Not Currently   Drug use: Not Currently   Sexual activity: Not Currently  Other Topics Concern   Not on file  Social History Narrative   Not on file   Social Determinants of Health   Financial Resource Strain: Medium Risk (01/10/2021)   Overall Financial Resource Strain (CARDIA)    Difficulty of Paying Living Expenses: Somewhat hard  Food Insecurity: No Food Insecurity (01/10/2021)   Hunger Vital Sign    Worried About Running Out of Food in the Last Year: Never true    Ran Out of Food in the Last Year: Never true  Transportation Needs: No Transportation Needs (01/10/2021)   PRAPARE - Administrator, Civil Service (Medical): No    Lack of Transportation (Non-Medical): No  Physical Activity: Not on file  Stress: No Stress Concern Present (01/10/2021)   [employer] of Occupational Health - Occupational Stress Questionnaire    Feeling of Stress : Not at all  Social Connections: Socially Integrated (01/10/2021)   Social Connection and Isolation Panel [NHANES]    Frequency of Communication with Friends and Family: Twice a week    Frequency of Social Gatherings with Friends and Family: Once a week    Attends Religious Services: More than 4 times per year    Active Member of [employer] or  Organizations: Yes    Attends Banker Meetings: Never    Marital Status: Married  Catering manager Violence: Not At Risk (01/10/2021)   Humiliation, Afraid, Rape, and Kick questionnaire    Fear of Current or Ex-Partner: No    Emotionally Abused: No    Physically Abused: No    Sexually Abused: No    FAMILY HISTORY:  Family History  Problem Relation Age of Onset   Stroke Mother    Cancer Paternal Uncle        lung cancer   Cancer Cousin        brain cancer    CURRENT MEDICATIONS:  Outpatient Encounter Medications as of 03/16/2023  Medication Sig   allopurinol (ZYLOPRIM) 300 MG tablet Take 300 mg by mouth daily.   Apple Cid Vn-Grn Tea-Bit Or-Cr (APPLE CIDER VINEGAR PLUS) TABS See admin instructions.   Ascorbic Acid (VITAMIN C) 1000 MG tablet 1 tablet   cetirizine (ZYRTEC) 10  MG tablet Take 10 mg by mouth daily.   cholecalciferol (VITAMIN D) 25 MCG (1000 UNIT) tablet 1 tablet   Cyanocobalamin (VITAMIN B12) 1000 MCG TBCR 1 tablet   lisinopril (ZESTRIL) 20 MG tablet Take 20 mg by mouth daily.   montelukast (SINGULAIR) 10 MG tablet    Multiple Vitamins-Minerals (CENTRUM ADULTS PO)    Omega-3 Fatty Acids (FISH OIL) 1000 MG CAPS 1 capsule   rosuvastatin (CRESTOR) 20 MG tablet Take 20 mg by mouth at bedtime.   traZODone (DESYREL) 100 MG tablet Take 100 mg by mouth at bedtime.   vitamin E 180 MG (400 UNITS) capsule Take 400 Units by mouth daily.   No facility-administered encounter medications on file as of 03/16/2023.    ALLERGIES:  Not on File  LABORATORY DATA:  I have reviewed the labs as listed.  CBC    Component Value Date/Time   WBC 6.8 03/09/2023 1257   RBC 3.26 (L) 03/09/2023 1257   HGB 11.5 (L) 03/09/2023 1257   HCT 33.7 (L) 03/09/2023 1257   HCT 33.8 (L) 01/20/2021 1416   PLT 131 (L) 03/09/2023 1257   MCV 103.4 (H) 03/09/2023 1257   MCH 35.3 (H) 03/09/2023 1257   MCHC 34.1 03/09/2023 1257   RDW 13.2 03/09/2023 1257   LYMPHSABS 2.0 03/09/2023 1257    MONOABS 0.7 03/09/2023 1257   EOSABS 0.3 03/09/2023 1257   BASOSABS 0.0 03/09/2023 1257      Latest Ref Rng & Units 03/09/2023   12:57 PM 03/03/2022    1:55 PM 09/03/2021    2:04 PM  CMP  Glucose 70 - 99 mg/dL 161  096  045   BUN 8 - 23 mg/dL 23  20  19    Creatinine 0.61 - 1.24 mg/dL 4.09  8.11  9.14   Sodium 135 - 145 mmol/L 138  138  137   Potassium 3.5 - 5.1 mmol/L 4.0  4.4  4.3   Chloride 98 - 111 mmol/L 105  108  103   CO2 22 - 32 mmol/L 24  25  26    Calcium 8.9 - 10.3 mg/dL 9.1  9.1  9.1   Total Protein 6.5 - 8.1 g/dL 7.0  7.2  7.2   Total Bilirubin 0.3 - 1.2 mg/dL 0.3  0.4  1.0   Alkaline Phos 38 - 126 U/L 59  62  69   AST 15 - 41 U/L 24  25  27    ALT 0 - 44 U/L 22  21  22      DIAGNOSTIC IMAGING:  I have independently reviewed the relevant imaging and discussed with the patient.   WRAP UP:  All questions were answered. The patient knows to call the clinic with any problems, questions or concerns.  Medical decision making: Low  Time spent on visit: I spent 15 minutes counseling the patient face to face. The total time spent in the appointment was 22 minutes and more than 50% was on counseling.  Carnella Guadalajara, PA-C  03/16/23 3:06 PM

## 2023-03-16 ENCOUNTER — Inpatient Hospital Stay (HOSPITAL_BASED_OUTPATIENT_CLINIC_OR_DEPARTMENT_OTHER): Payer: Medicare PPO | Admitting: Physician Assistant

## 2023-03-16 ENCOUNTER — Encounter: Payer: Self-pay | Admitting: Physician Assistant

## 2023-03-16 VITALS — BP 117/66 | HR 65 | Temp 97.8°F | Resp 18 | Ht 64.5 in | Wt 170.0 lb

## 2023-03-16 DIAGNOSIS — E611 Iron deficiency: Secondary | ICD-10-CM | POA: Diagnosis not present

## 2023-03-16 DIAGNOSIS — D539 Nutritional anemia, unspecified: Secondary | ICD-10-CM | POA: Diagnosis not present

## 2023-03-16 DIAGNOSIS — D696 Thrombocytopenia, unspecified: Secondary | ICD-10-CM | POA: Diagnosis not present

## 2023-03-16 NOTE — Patient Instructions (Signed)
[facility] at [facility] Discharge Instructions  You were seen today by Rojelio Brenner PA-C for your anemia.    It is possible that your blood and platelets abnormalities may be due to a type of low-grade bone marrow cancer called MDS (myelodysplastic syndrome).  At this time, your blood abnormalities are very mild and do not require any treatment.  We will continue to watch and monitor them.  If we see any major changes in the future, we will discuss the need for a possible bone marrow biopsy.  Continue taking on over-the-counter iron supplement once daily in the morning.  Take it with a glass of orange juice.  Use a stool softener if it causes constipation.   Continue to stay away from drinking alcohol, as this can also be toxic to your bone marrow and cause many other health conditions.  LABS: Return in 6 months and 12 months for repeat labs  OTHER TESTS: None  FOLLOW-UP APPOINTMENT: Office visit in 1 year, about a week after your labs are checked   Thank you for choosing [facility] at [facility] to provide your oncology and hematology care.  To afford each patient quality time with our provider, please arrive at least 15 minutes before your scheduled appointment time.   If you have a lab appointment with the Cancer Center please come in thru the Main Entrance and check in at the main information desk.  You need to re-schedule your appointment should you arrive 10 or more minutes late.  We strive to give you quality time with our providers, and arriving late affects you and other patients whose appointments are after yours.  Also, if you no show three or more times for appointments you may be dismissed from the clinic at the providers discretion.     Again, thank you for choosing [facility].  Our hope is that these requests will decrease the amount of time that you wait before being seen by our physicians.        _____________________________________________________________  Should you have questions after your visit to [facility], please contact our office at [phone] and follow the prompts.  Our office hours are 8:00 a.m. and 4:30 p.m. Monday - Friday.  Please note that voicemails left after 4:00 p.m. may not be returned until the following business day.  We are closed weekends and major holidays.  You do have access to a nurse 24-7, just call the main number to the clinic [phone] and do not press any options, hold on the line and a nurse will answer the phone.    For prescription refill requests, have your pharmacy contact our office and allow 72 hours.    Due to Covid, you will need to wear a mask upon entering the hospital. If you do not have a mask, a mask will be given to you at the Main Entrance upon arrival. For doctor visits, patients may have 1 support person age 19 or older with them. For treatment visits, patients can not have anyone with them due to social distancing guidelines and our immunocompromised population.

## 2023-03-17 ENCOUNTER — Other Ambulatory Visit: Payer: Self-pay

## 2023-03-17 DIAGNOSIS — E611 Iron deficiency: Secondary | ICD-10-CM

## 2023-03-17 DIAGNOSIS — D539 Nutritional anemia, unspecified: Secondary | ICD-10-CM

## 2023-03-17 DIAGNOSIS — D696 Thrombocytopenia, unspecified: Secondary | ICD-10-CM

## 2023-09-23 ENCOUNTER — Other Ambulatory Visit: Payer: Self-pay

## 2023-09-23 DIAGNOSIS — D696 Thrombocytopenia, unspecified: Secondary | ICD-10-CM

## 2023-09-24 ENCOUNTER — Inpatient Hospital Stay: Payer: Medicare PPO | Attending: Hematology

## 2023-09-24 ENCOUNTER — Inpatient Hospital Stay: Payer: Medicare PPO

## 2023-09-24 DIAGNOSIS — E611 Iron deficiency: Secondary | ICD-10-CM | POA: Insufficient documentation

## 2023-09-24 DIAGNOSIS — D649 Anemia, unspecified: Secondary | ICD-10-CM | POA: Diagnosis present

## 2023-09-24 DIAGNOSIS — D696 Thrombocytopenia, unspecified: Secondary | ICD-10-CM | POA: Diagnosis not present

## 2023-09-24 LAB — CBC WITH DIFFERENTIAL/PLATELET
Abs Immature Granulocytes: 0.06 10*3/uL (ref 0.00–0.07)
Basophils Absolute: 0 10*3/uL (ref 0.0–0.1)
Basophils Relative: 0 %
Eosinophils Absolute: 0.2 10*3/uL (ref 0.0–0.5)
Eosinophils Relative: 2 %
HCT: 34.2 % — ABNORMAL LOW (ref 39.0–52.0)
Hemoglobin: 11.4 g/dL — ABNORMAL LOW (ref 13.0–17.0)
Immature Granulocytes: 1 %
Lymphocytes Relative: 32 %
Lymphs Abs: 2.9 10*3/uL (ref 0.7–4.0)
MCH: 35 pg — ABNORMAL HIGH (ref 26.0–34.0)
MCHC: 33.3 g/dL (ref 30.0–36.0)
MCV: 104.9 fL — ABNORMAL HIGH (ref 80.0–100.0)
Monocytes Absolute: 0.7 10*3/uL (ref 0.1–1.0)
Monocytes Relative: 8 %
Neutro Abs: 5.2 10*3/uL (ref 1.7–7.7)
Neutrophils Relative %: 57 %
Platelets: 134 10*3/uL — ABNORMAL LOW (ref 150–400)
RBC: 3.26 MIL/uL — ABNORMAL LOW (ref 4.22–5.81)
RDW: 13.3 % (ref 11.5–15.5)
WBC: 9.1 10*3/uL (ref 4.0–10.5)
nRBC: 0 % (ref 0.0–0.2)

## 2024-03-08 ENCOUNTER — Inpatient Hospital Stay: Payer: TRICARE For Life (TFL) | Attending: Hematology

## 2024-03-08 DIAGNOSIS — E611 Iron deficiency: Secondary | ICD-10-CM | POA: Insufficient documentation

## 2024-03-08 DIAGNOSIS — D539 Nutritional anemia, unspecified: Secondary | ICD-10-CM | POA: Diagnosis present

## 2024-03-08 DIAGNOSIS — D696 Thrombocytopenia, unspecified: Secondary | ICD-10-CM | POA: Diagnosis not present

## 2024-03-08 DIAGNOSIS — Z87891 Personal history of nicotine dependence: Secondary | ICD-10-CM | POA: Diagnosis not present

## 2024-03-08 DIAGNOSIS — Z801 Family history of malignant neoplasm of trachea, bronchus and lung: Secondary | ICD-10-CM | POA: Diagnosis not present

## 2024-03-08 DIAGNOSIS — Z808 Family history of malignant neoplasm of other organs or systems: Secondary | ICD-10-CM | POA: Insufficient documentation

## 2024-03-08 LAB — CBC WITH DIFFERENTIAL/PLATELET
Abs Immature Granulocytes: 0.07 10*3/uL (ref 0.00–0.07)
Basophils Absolute: 0 10*3/uL (ref 0.0–0.1)
Basophils Relative: 1 %
Eosinophils Absolute: 0.2 10*3/uL (ref 0.0–0.5)
Eosinophils Relative: 3 %
HCT: 33.7 % — ABNORMAL LOW (ref 39.0–52.0)
Hemoglobin: 11.9 g/dL — ABNORMAL LOW (ref 13.0–17.0)
Immature Granulocytes: 1 %
Lymphocytes Relative: 33 %
Lymphs Abs: 2.5 10*3/uL (ref 0.7–4.0)
MCH: 36.3 pg — ABNORMAL HIGH (ref 26.0–34.0)
MCHC: 35.3 g/dL (ref 30.0–36.0)
MCV: 102.7 fL — ABNORMAL HIGH (ref 80.0–100.0)
Monocytes Absolute: 0.6 10*3/uL (ref 0.1–1.0)
Monocytes Relative: 8 %
Neutro Abs: 4.1 10*3/uL (ref 1.7–7.7)
Neutrophils Relative %: 54 %
Platelets: 139 10*3/uL — ABNORMAL LOW (ref 150–400)
RBC: 3.28 MIL/uL — ABNORMAL LOW (ref 4.22–5.81)
RDW: 13.3 % (ref 11.5–15.5)
WBC: 7.6 10*3/uL (ref 4.0–10.5)
nRBC: 0 % (ref 0.0–0.2)

## 2024-03-08 LAB — FERRITIN: Ferritin: 95 ng/mL (ref 24–336)

## 2024-03-08 LAB — COMPREHENSIVE METABOLIC PANEL WITH GFR
ALT: 31 U/L (ref 0–44)
AST: 31 U/L (ref 15–41)
Albumin: 3.8 g/dL (ref 3.5–5.0)
Alkaline Phosphatase: 71 U/L (ref 38–126)
Anion gap: 4 — ABNORMAL LOW (ref 5–15)
BUN: 25 mg/dL — ABNORMAL HIGH (ref 8–23)
CO2: 23 mmol/L (ref 22–32)
Calcium: 8.9 mg/dL (ref 8.9–10.3)
Chloride: 103 mmol/L (ref 98–111)
Creatinine, Ser: 1.29 mg/dL — ABNORMAL HIGH (ref 0.61–1.24)
GFR, Estimated: 58 mL/min — ABNORMAL LOW (ref >60)
Glucose, Bld: 106 mg/dL — ABNORMAL HIGH (ref 70–99)
Potassium: 4.2 mmol/L (ref 3.5–5.1)
Sodium: 130 mmol/L — ABNORMAL LOW (ref 135–145)
Total Bilirubin: 0.7 mg/dL (ref 0.0–1.2)
Total Protein: 6.8 g/dL (ref 6.5–8.1)

## 2024-03-08 LAB — VITAMIN B12: Vitamin B-12: 1205 pg/mL — ABNORMAL HIGH (ref 180–914)

## 2024-03-08 LAB — IRON AND TIBC
Iron: 90 ug/dL (ref 45–182)
Saturation Ratios: 31 % (ref 17.9–39.5)
TIBC: 292 ug/dL (ref 250–450)
UIBC: 202 ug/dL

## 2024-03-08 LAB — LACTATE DEHYDROGENASE: LDH: 178 U/L (ref 98–192)

## 2024-03-12 LAB — METHYLMALONIC ACID, SERUM: Methylmalonic Acid, Quantitative: 298 nmol/L (ref 0–378)

## 2024-03-14 NOTE — Progress Notes (Unsigned)
 [facility] [address], [place] [address]   CLINIC:  Medical Oncology/Hematology  PCP:  Otis Blocker, MD [address] [place] [address] [phone]   REASON FOR VISIT:  Follow-up for macrocytic anemia   PRIOR THERAPY: None   CURRENT THERAPY: Observation  INTERVAL HISTORY:   Mr. Timothy Flores 73 y.o. male returns for routine follow-up of macrocytic anemia and thrombocytopenia.  He was last seen by Sheril Dines, PA-C on 03/16/2023.   At today's visit, he reports feeling well.  No recent hospitalizations, surgeries, or changes in baseline health status. He admits to easy bruising, but denies any petechial rash. No signs of bleeding such as epistaxis, gum bleeding, hematuria, hematemesis, hematochezia, or melena.   He denies any B symptoms such as fever, chills, night sweats, unintentional weight loss.  As previously noted, he stopped drinking alcohol in March 2022, and has not started that again.   He is taking iron pill daily without any adverse effects. He has 100% energy and 100% appetite. He endorses that he is maintaining a stable weight.  ASSESSMENT & PLAN:  1.  Macrocytic anemia & mild thrombocytopenia - Normal B12 and folic acid  levels, hepatitis panel nonreactive, no evidence of hemolysis or monoclonal protein.  TSH normal. - Reticulocyte index of 1.08 indicates hypoproliferation - Abdominal ultrasound (11/03/2021): No apparent liver or spleen abnormalities. - Patient also has history of alcohol consumption, but has stopped drinking entirely since 01/10/2021 - No B symptoms or complaints of blood loss. No abnormal bleeding, bruising, or petechial rash.   - Most recent labs (03/08/2024): Hgb 11.9/MCV 102.7, platelets 139.  Normal WBC and differential. LDH normal. - Differential diagnosis favors early MDS - Patient opted to observe for now, but he is open to reconsider bone marrow biopsy in the future if there is a decline in blood counts   - PLAN: CBC/D only in 6 months. - Labs and office visit in 1 year. - Patient declines BMBx.  Will readdress possible bone marrow biopsy if any significant progression or deviation from baseline (Hgb <10.0, platelets <100).   2.  Mild iron deficiency -  Iron panel (09/03/2021) showed marginal ferritin 25, iron saturation 30%, normal TIBC - Was started on iron supplementation at last visit, has been taking for the past year - Most recent iron panel (03/08/2024) is improved with ferritin 95 and iron saturation 31%.  Normal B12/MMA. - PLAN: Continue ferrous sulfate 325 mg daily.  - Recheck iron panel in 1 year.      PLAN SUMMARY: >> CBC/D only in 6 months >> Labs in 1 year = CBC/D, LDH, ferritin, iron/TIBC >> OFFICE visit in 1 year (1 week after labs)     REVIEW OF SYSTEMS:   Review of Systems  Constitutional:  Negative for appetite change, chills, diaphoresis, fatigue, fever and unexpected weight change.  HENT:   Negative for lump/mass and nosebleeds.   Eyes:  Negative for eye problems.  Respiratory:  Negative for cough, hemoptysis and shortness of breath.   Cardiovascular:  Negative for chest pain, leg swelling and palpitations.  Gastrointestinal:  Negative for abdominal pain, blood in stool, constipation, diarrhea, nausea and vomiting.  Genitourinary:  Negative for hematuria.   Skin: Negative.   Neurological:  Negative for dizziness, headaches and light-headedness.  Hematological:  Does not bruise/bleed easily.     PHYSICAL EXAM:  ECOG PERFORMANCE STATUS: 0 - Asymptomatic  Vitals:   03/15/24 1338  BP: 128/70  Pulse: 60  Resp:  17  Temp: 98.2 F (36.8 C)  SpO2: 100%   Filed Weights   03/15/24 1338  Weight: 173 lb 11.6 oz (78.8 kg)   Physical Exam Constitutional:      Appearance: Normal appearance. He is obese.  Cardiovascular:     Heart sounds: Normal heart sounds.  Pulmonary:     Breath sounds: Normal breath sounds.  Neurological:     General: No focal deficit  present.     Mental Status: Mental status is at baseline.  Psychiatric:        Behavior: Behavior normal. Behavior is cooperative.    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   Hypertension    Past Surgical History:  Procedure Laterality Date   cancer removal     oral cancer 2013    SOCIAL HISTORY:  Social History   Socioeconomic History   Marital status: Married    Spouse name: Not on file   Number of children: 2   Years of education: Not on file   Highest education level: Not on file  Occupational History   Occupation: Research scientist (medical) center   Occupation: retired   Occupation: Hotel manager  Tobacco Use   Smoking status: Former    Types: Cigarettes   Smokeless tobacco: Former  Substance and Sexual Activity   Alcohol use: Not Currently   Drug use: Not Currently   Sexual activity: Not Currently  Other Topics Concern   Not on file  Social History Narrative   Not on file   Social Drivers of Health   Financial Resource Strain: Medium Risk (01/10/2021)   Overall Financial Resource Strain (CARDIA)    Difficulty of Paying Living Expenses: Somewhat hard  Food Insecurity: No Food Insecurity (01/10/2021)   Hunger Vital Sign    Worried About Running Out of Food in the Last Year: Never true    Ran Out of Food in the Last Year: Never true  Transportation Needs: No Transportation Needs (01/10/2021)   PRAPARE - Administrator, Civil Service (Medical): No    Lack of Transportation (Non-Medical): No  Physical Activity: Not on file  Stress: No Stress Concern Present (01/10/2021)   [employer] of Occupational Health - Occupational Stress Questionnaire    Feeling of Stress : Not at all  Social Connections: Socially Integrated (01/10/2021)   Social Connection and Isolation Panel [NHANES]    Frequency of Communication with Friends and Family: Twice a week    Frequency of Social Gatherings with  Friends and Family: Once a week    Attends Religious Services: More than 4 times per year    Active Member of [employer] or Organizations: Yes    Attends Banker Meetings: Never    Marital Status: Married  Catering manager Violence: Not At Risk (01/10/2021)   Humiliation, Afraid, Rape, and Kick questionnaire    Fear of Current or Ex-Partner: No    Emotionally Abused: No    Physically Abused: No    Sexually Abused: No    FAMILY HISTORY:  Family History  Problem Relation Age of Onset   Stroke Mother    Cancer Paternal Uncle        lung cancer   Cancer Cousin        brain cancer    CURRENT MEDICATIONS:  Outpatient Encounter Medications as of 03/15/2024  Medication Sig   allopurinol (ZYLOPRIM) 300 MG tablet Take 300 mg by mouth daily.  Apple Cid Vn-Grn Tea-Bit Or-Cr (APPLE CIDER VINEGAR PLUS) TABS See admin instructions.   Ascorbic Acid (VITAMIN C) 1000 MG tablet 1 tablet   cetirizine (ZYRTEC) 10 MG tablet Take 10 mg by mouth daily.   cholecalciferol (VITAMIN D) 25 MCG (1000 UNIT) tablet 1 tablet   Cyanocobalamin  (VITAMIN B12) 1000 MCG TBCR 1 tablet   lisinopril (ZESTRIL) 20 MG tablet Take 20 mg by mouth daily.   montelukast (SINGULAIR) 10 MG tablet    Multiple Vitamins-Minerals (CENTRUM ADULTS PO)    Omega-3 Fatty Acids (FISH OIL) 1000 MG CAPS 1 capsule   OVER THE COUNTER MEDICATION Take 100 mg by mouth daily. Magnesium tablets OTC   rosuvastatin (CRESTOR) 20 MG tablet Take 20 mg by mouth at bedtime.   traZODone (DESYREL) 100 MG tablet Take 100 mg by mouth at bedtime.   vitamin E 180 MG (400 UNITS) capsule Take 400 Units by mouth daily.   No facility-administered encounter medications on file as of 03/15/2024.    ALLERGIES:  Not on File  LABORATORY DATA:  I have reviewed the labs as listed.  CBC    Component Value Date/Time   WBC 7.6 03/08/2024 1409   RBC 3.28 (L) 03/08/2024 1409   HGB 11.9 (L) 03/08/2024 1409   HCT 33.7 (L) 03/08/2024 1409   HCT 33.8 (L)  01/20/2021 1416   PLT 139 (L) 03/08/2024 1409   MCV 102.7 (H) 03/08/2024 1409   MCH 36.3 (H) 03/08/2024 1409   MCHC 35.3 03/08/2024 1409   RDW 13.3 03/08/2024 1409   LYMPHSABS 2.5 03/08/2024 1409   MONOABS 0.6 03/08/2024 1409   EOSABS 0.2 03/08/2024 1409   BASOSABS 0.0 03/08/2024 1409      Latest Ref Rng & Units 03/08/2024    2:09 PM 03/09/2023   12:57 PM 03/03/2022    1:55 PM  CMP  Glucose 70 - 99 mg/dL 161  096  045   BUN 8 - 23 mg/dL 25  23  20    Creatinine 0.61 - 1.24 mg/dL 4.09  8.11  9.14   Sodium 135 - 145 mmol/L 130  138  138   Potassium 3.5 - 5.1 mmol/L 4.2  4.0  4.4   Chloride 98 - 111 mmol/L 103  105  108   CO2 22 - 32 mmol/L 23  24  25    Calcium 8.9 - 10.3 mg/dL 8.9  9.1  9.1   Total Protein 6.5 - 8.1 g/dL 6.8  7.0  7.2   Total Bilirubin 0.0 - 1.2 mg/dL 0.7  0.3  0.4   Alkaline Phos 38 - 126 U/L 71  59  62   AST 15 - 41 U/L 31  24  25    ALT 0 - 44 U/L 31  22  21      DIAGNOSTIC IMAGING:  I have independently reviewed the relevant imaging and discussed with the patient.   WRAP UP:  All questions were answered. The patient knows to call the clinic with any problems, questions or concerns.  Medical decision making: Low  Time spent on visit: I spent 15 minutes counseling the patient face to face. The total time spent in the appointment was 22 minutes and more than 50% was on counseling.  Sonnie Dusky, PA-C  03/15/24 1:39 PM

## 2024-03-15 ENCOUNTER — Encounter: Payer: Self-pay | Admitting: Physician Assistant

## 2024-03-15 ENCOUNTER — Inpatient Hospital Stay (HOSPITAL_BASED_OUTPATIENT_CLINIC_OR_DEPARTMENT_OTHER): Payer: TRICARE For Life (TFL) | Admitting: Physician Assistant

## 2024-03-15 VITALS — BP 128/70 | HR 60 | Temp 98.2°F | Resp 17 | Wt 173.7 lb

## 2024-03-15 DIAGNOSIS — D539 Nutritional anemia, unspecified: Secondary | ICD-10-CM

## 2024-03-15 DIAGNOSIS — D696 Thrombocytopenia, unspecified: Secondary | ICD-10-CM

## 2024-03-15 DIAGNOSIS — E611 Iron deficiency: Secondary | ICD-10-CM

## 2024-03-15 NOTE — Patient Instructions (Signed)
[facility] at [facility] Discharge Instructions  You were seen today by Rojelio Brenner PA-C for your anemia.    It is possible that your blood and platelets abnormalities may be due to a type of low-grade bone marrow cancer called MDS (myelodysplastic syndrome).  At this time, your blood abnormalities are very mild and do not require any treatment.  We will continue to watch and monitor them.  If we see any major changes in the future, we will discuss the need for a possible bone marrow biopsy.  Continue taking on over-the-counter iron supplement once daily in the morning.  Take it with a glass of orange juice.  Use a stool softener if it causes constipation.   Continue to stay away from drinking alcohol, as this can also be toxic to your bone marrow and cause many other health conditions.  LABS: Return in 6 months and 12 months for repeat labs  OTHER TESTS: None  FOLLOW-UP APPOINTMENT: Office visit in 1 year, about a week after your labs are checked   Thank you for choosing [facility] at [facility] to provide your oncology and hematology care.  To afford each patient quality time with our provider, please arrive at least 15 minutes before your scheduled appointment time.   If you have a lab appointment with the Cancer Center please come in thru the Main Entrance and check in at the main information desk.  You need to re-schedule your appointment should you arrive 10 or more minutes late.  We strive to give you quality time with our providers, and arriving late affects you and other patients whose appointments are after yours.  Also, if you no show three or more times for appointments you may be dismissed from the clinic at the providers discretion.     Again, thank you for choosing [facility].  Our hope is that these requests will decrease the amount of time that you wait before being seen by our physicians.        _____________________________________________________________  Should you have questions after your visit to [facility], please contact our office at [phone] and follow the prompts.  Our office hours are 8:00 a.m. and 4:30 p.m. Monday - Friday.  Please note that voicemails left after 4:00 p.m. may not be returned until the following business day.  We are closed weekends and major holidays.  You do have access to a nurse 24-7, just call the main number to the clinic [phone] and do not press any options, hold on the line and a nurse will answer the phone.    For prescription refill requests, have your pharmacy contact our office and allow 72 hours.    Due to Covid, you will need to wear a mask upon entering the hospital. If you do not have a mask, a mask will be given to you at the Main Entrance upon arrival. For doctor visits, patients may have 1 support person age 19 or older with them. For treatment visits, patients can not have anyone with them due to social distancing guidelines and our immunocompromised population.

## 2024-05-16 DIAGNOSIS — R319 Hematuria, unspecified: Secondary | ICD-10-CM | POA: Insufficient documentation

## 2024-05-16 DIAGNOSIS — E785 Hyperlipidemia, unspecified: Secondary | ICD-10-CM | POA: Insufficient documentation

## 2024-05-16 DIAGNOSIS — J449 Chronic obstructive pulmonary disease, unspecified: Secondary | ICD-10-CM | POA: Insufficient documentation

## 2024-05-16 DIAGNOSIS — E790 Hyperuricemia without signs of inflammatory arthritis and tophaceous disease: Secondary | ICD-10-CM | POA: Insufficient documentation

## 2024-05-16 DIAGNOSIS — F1010 Alcohol abuse, uncomplicated: Secondary | ICD-10-CM | POA: Insufficient documentation

## 2024-05-16 DIAGNOSIS — M109 Gout, unspecified: Secondary | ICD-10-CM | POA: Insufficient documentation

## 2024-05-16 DIAGNOSIS — G4700 Insomnia, unspecified: Secondary | ICD-10-CM | POA: Insufficient documentation

## 2024-05-16 DIAGNOSIS — R7989 Other specified abnormal findings of blood chemistry: Secondary | ICD-10-CM | POA: Insufficient documentation

## 2024-05-16 DIAGNOSIS — Z79899 Other long term (current) drug therapy: Secondary | ICD-10-CM | POA: Insufficient documentation

## 2024-05-16 DIAGNOSIS — G4733 Obstructive sleep apnea (adult) (pediatric): Secondary | ICD-10-CM | POA: Insufficient documentation

## 2024-05-16 DIAGNOSIS — F419 Anxiety disorder, unspecified: Secondary | ICD-10-CM | POA: Insufficient documentation

## 2024-05-16 DIAGNOSIS — N179 Acute kidney failure, unspecified: Secondary | ICD-10-CM | POA: Insufficient documentation

## 2024-05-16 DIAGNOSIS — I10 Essential (primary) hypertension: Secondary | ICD-10-CM | POA: Insufficient documentation

## 2024-05-18 ENCOUNTER — Ambulatory Visit: Admitting: Orthopedic Surgery

## 2024-05-18 ENCOUNTER — Encounter: Payer: Self-pay | Admitting: Orthopedic Surgery

## 2024-05-18 ENCOUNTER — Other Ambulatory Visit (INDEPENDENT_AMBULATORY_CARE_PROVIDER_SITE_OTHER): Payer: Self-pay

## 2024-05-18 VITALS — BP 128/70 | Ht 65.0 in | Wt 176.0 lb

## 2024-05-18 DIAGNOSIS — M25541 Pain in joints of right hand: Secondary | ICD-10-CM | POA: Diagnosis not present

## 2024-05-18 NOTE — Progress Notes (Signed)
  Intake history:  Ht 5' 5 (1.651 m)   Wt 176 lb (79.8 kg)   BMI 29.29 kg/m  Body mass index is 29.29 kg/m.    WHAT ARE WE SEEING YOU FOR TODAY?   right hand(s)  How long has this bothered you? (DOI?DOS?WS?)  8 year(s) ago getting worse, Right middle finger turns in and is enlarged getting worse/ entire hand feels uncomfortable hard to make fist stiff   Anticoag.  No  Diabetes No  Heart disease No  Hypertension Yes  SMOKING HX No  Kidney disease No     Latest Ref Rng & Units 03/08/2024    2:09 PM 03/09/2023   12:57 PM 03/03/2022    1:55 PM  CMP  Glucose 70 - 99 mg/dL 893  895  898   BUN 8 - 23 mg/dL 25  23  20    Creatinine 0.61 - 1.24 mg/dL 8.70  8.83  8.86   Sodium 135 - 145 mmol/L 130  138  138   Potassium 3.5 - 5.1 mmol/L 4.2  4.0  4.4   Chloride 98 - 111 mmol/L 103  105  108   CO2 22 - 32 mmol/L 23  24  25    Calcium 8.9 - 10.3 mg/dL 8.9  9.1  9.1   Total Protein 6.5 - 8.1 g/dL 6.8  7.0  7.2   Total Bilirubin 0.0 - 1.2 mg/dL 0.7  0.3  0.4   Alkaline Phos 38 - 126 U/L 71  59  62   AST 15 - 41 U/L 31  24  25    ALT 0 - 44 U/L 31  22  21       Any ALLERGIES ______________________________________________   Treatment:  Have you taken:  Tylenol Yes  Advil Yes  Had PT No  Had injection No  Other  _______________has Gout / taking Allopurinol __________

## 2024-05-18 NOTE — Patient Instructions (Signed)
We are referring you to [facility] from [facility] address is [address] [place] The phone number is [phone]  The office will call you with an appointment Dr. Fara Boros

## 2024-05-18 NOTE — Progress Notes (Signed)
 Chief Complaint  Patient presents with   Hand Pain    Right     73 year old male presents with pain in his right hand and deformity of his right long finger.  Patient has a history of gout takes allopurinol.  He says that he has had progressive deformity of the right long finger at the PIP joint he has some difficulty grabbing certain objects.  It is more uncomfortable than actual pain.  He is also has had some swelling of the ring finger PIP joint  Medical history has been reviewed  BP 128/70 Comment: 03/15/24  Ht 5' 5 (1.651 m)   Wt 176 lb (79.8 kg)   BMI 29.29 kg/m   Right hand shows ulnar deviation of the right long finger at the PIP joint with swelling of the PIP joint of the right long finger and right ring finger.  The patient cannot make a full fist on that hand and there is some crossover with flexion of the hand  No tenderness is noted over the PIP joints   DG Hand Complete Right Result Date: 05/18/2024 X-ray report Chief complaint pain plus deformity right long finger Images 3 views right hand Reading: PIP joint of the right long finger and right ring finger show degenerative changes with subluxation of the middle phalanx in reference to the proximal phalanx involving the PIP joint of the right long finger.  There are periarticular osteophytes.  There is no evidence of cyst formation around the joint.  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

## 2024-06-26 ENCOUNTER — Ambulatory Visit: Admitting: Orthopedic Surgery

## 2024-08-21 ENCOUNTER — Ambulatory Visit: Admitting: Orthopedic Surgery

## 2024-09-07 ENCOUNTER — Other Ambulatory Visit (HOSPITAL_COMMUNITY): Payer: Self-pay | Admitting: Nurse Practitioner

## 2024-09-07 DIAGNOSIS — M810 Age-related osteoporosis without current pathological fracture: Secondary | ICD-10-CM

## 2024-09-18 ENCOUNTER — Other Ambulatory Visit

## 2024-09-18 ENCOUNTER — Inpatient Hospital Stay: Attending: Physician Assistant

## 2024-09-18 DIAGNOSIS — D539 Nutritional anemia, unspecified: Secondary | ICD-10-CM | POA: Insufficient documentation

## 2024-09-18 DIAGNOSIS — E611 Iron deficiency: Secondary | ICD-10-CM | POA: Diagnosis not present

## 2024-09-18 DIAGNOSIS — D696 Thrombocytopenia, unspecified: Secondary | ICD-10-CM | POA: Diagnosis not present

## 2024-09-18 LAB — CBC WITH DIFFERENTIAL/PLATELET
Abs Immature Granulocytes: 0.22 K/uL — ABNORMAL HIGH (ref 0.00–0.07)
Basophils Absolute: 0.1 K/uL (ref 0.0–0.1)
Basophils Relative: 1 %
Eosinophils Absolute: 0.2 K/uL (ref 0.0–0.5)
Eosinophils Relative: 2 %
HCT: 37.4 % — ABNORMAL LOW (ref 39.0–52.0)
Hemoglobin: 12.4 g/dL — ABNORMAL LOW (ref 13.0–17.0)
Immature Granulocytes: 2 %
Lymphocytes Relative: 30 %
Lymphs Abs: 3.1 K/uL (ref 0.7–4.0)
MCH: 34.2 pg — ABNORMAL HIGH (ref 26.0–34.0)
MCHC: 33.2 g/dL (ref 30.0–36.0)
MCV: 103 fL — ABNORMAL HIGH (ref 80.0–100.0)
Monocytes Absolute: 1.1 K/uL — ABNORMAL HIGH (ref 0.1–1.0)
Monocytes Relative: 11 %
Neutro Abs: 5.6 K/uL (ref 1.7–7.7)
Neutrophils Relative %: 54 %
Platelets: 159 K/uL (ref 150–400)
RBC: 3.63 MIL/uL — ABNORMAL LOW (ref 4.22–5.81)
RDW: 14.7 % (ref 11.5–15.5)
WBC: 10.3 K/uL (ref 4.0–10.5)
nRBC: 0 % (ref 0.0–0.2)

## 2024-09-25 ENCOUNTER — Ambulatory Visit: Admitting: Physician Assistant

## 2024-09-25 ENCOUNTER — Ambulatory Visit (HOSPITAL_COMMUNITY)
Admission: RE | Admit: 2024-09-25 | Discharge: 2024-09-25 | Disposition: A | Source: Ambulatory Visit | Attending: Nurse Practitioner | Admitting: Nurse Practitioner

## 2024-09-25 DIAGNOSIS — M810 Age-related osteoporosis without current pathological fracture: Secondary | ICD-10-CM

## 2025-03-14 ENCOUNTER — Inpatient Hospital Stay

## 2025-03-21 ENCOUNTER — Inpatient Hospital Stay: Admitting: Physician Assistant
